# Patient Record
Sex: Female | Born: 1950 | Race: White | Hispanic: No | Marital: Married | Smoking: Never smoker
Health system: Southern US, Community
[De-identification: ages and names within clinical notes are randomized; demographics above are authoritative.]

## PROBLEM LIST (undated history)

## (undated) DIAGNOSIS — Z950 Presence of cardiac pacemaker: Secondary | ICD-10-CM

## (undated) DIAGNOSIS — R112 Nausea with vomiting, unspecified: Secondary | ICD-10-CM

## (undated) DIAGNOSIS — I959 Hypotension, unspecified: Secondary | ICD-10-CM

## (undated) DIAGNOSIS — E785 Hyperlipidemia, unspecified: Secondary | ICD-10-CM

## (undated) DIAGNOSIS — T8859XA Other complications of anesthesia, initial encounter: Secondary | ICD-10-CM

## (undated) DIAGNOSIS — I10 Essential (primary) hypertension: Secondary | ICD-10-CM

## (undated) DIAGNOSIS — R0789 Other chest pain: Secondary | ICD-10-CM

## (undated) DIAGNOSIS — I447 Left bundle-branch block, unspecified: Secondary | ICD-10-CM

## (undated) DIAGNOSIS — I2510 Atherosclerotic heart disease of native coronary artery without angina pectoris: Secondary | ICD-10-CM

## (undated) HISTORY — DX: Left bundle-branch block, unspecified: I44.7

## (undated) HISTORY — DX: Hypotension, unspecified: I95.9

## (undated) HISTORY — DX: Hyperlipidemia, unspecified: E78.5

## (undated) HISTORY — DX: Other chest pain: R07.89

## (undated) HISTORY — DX: Essential (primary) hypertension: I10

## (undated) HISTORY — DX: Atherosclerotic heart disease of native coronary artery without angina pectoris: I25.10

## (undated) HISTORY — PX: ROTATOR CUFF REPAIR: SHX139

## (undated) HISTORY — PX: ABDOMINAL HYSTERECTOMY: SUR658

---

## 1998-12-31 ENCOUNTER — Encounter: Payer: Self-pay | Admitting: Family Medicine

## 1998-12-31 ENCOUNTER — Encounter: Admission: RE | Admit: 1998-12-31 | Discharge: 1998-12-31 | Payer: Self-pay | Admitting: Family Medicine

## 1999-09-21 ENCOUNTER — Encounter: Admission: RE | Admit: 1999-09-21 | Discharge: 1999-09-21 | Payer: Self-pay | Admitting: Family Medicine

## 1999-09-21 ENCOUNTER — Encounter: Payer: Self-pay | Admitting: Family Medicine

## 2000-05-30 ENCOUNTER — Encounter: Payer: Self-pay | Admitting: Family Medicine

## 2000-05-30 ENCOUNTER — Encounter: Admission: RE | Admit: 2000-05-30 | Discharge: 2000-05-30 | Payer: Self-pay | Admitting: Family Medicine

## 2000-11-07 ENCOUNTER — Encounter: Admission: RE | Admit: 2000-11-07 | Discharge: 2000-11-07 | Payer: Self-pay | Admitting: Family Medicine

## 2000-11-07 ENCOUNTER — Encounter: Payer: Self-pay | Admitting: Family Medicine

## 2000-11-21 ENCOUNTER — Ambulatory Visit (HOSPITAL_COMMUNITY): Admission: AD | Admit: 2000-11-21 | Discharge: 2000-11-21 | Payer: Self-pay | Admitting: *Deleted

## 2000-11-21 HISTORY — PX: CARDIAC CATHETERIZATION: SHX172

## 2002-04-02 ENCOUNTER — Encounter: Admission: RE | Admit: 2002-04-02 | Discharge: 2002-04-02 | Payer: Self-pay | Admitting: Orthopedic Surgery

## 2002-04-02 ENCOUNTER — Encounter: Payer: Self-pay | Admitting: Orthopedic Surgery

## 2002-04-04 ENCOUNTER — Ambulatory Visit (HOSPITAL_BASED_OUTPATIENT_CLINIC_OR_DEPARTMENT_OTHER): Admission: RE | Admit: 2002-04-04 | Discharge: 2002-04-04 | Payer: Self-pay | Admitting: Orthopedic Surgery

## 2002-05-01 ENCOUNTER — Encounter: Admission: RE | Admit: 2002-05-01 | Discharge: 2002-07-30 | Payer: Self-pay | Admitting: Family Medicine

## 2004-01-29 ENCOUNTER — Encounter: Admission: RE | Admit: 2004-01-29 | Discharge: 2004-01-29 | Payer: Self-pay | Admitting: Family Medicine

## 2004-06-03 ENCOUNTER — Ambulatory Visit (HOSPITAL_BASED_OUTPATIENT_CLINIC_OR_DEPARTMENT_OTHER): Admission: RE | Admit: 2004-06-03 | Discharge: 2004-06-03 | Payer: Self-pay | Admitting: Orthopedic Surgery

## 2004-06-03 ENCOUNTER — Ambulatory Visit (HOSPITAL_COMMUNITY): Admission: RE | Admit: 2004-06-03 | Discharge: 2004-06-03 | Payer: Self-pay | Admitting: Orthopedic Surgery

## 2004-06-17 ENCOUNTER — Ambulatory Visit (HOSPITAL_COMMUNITY): Admission: RE | Admit: 2004-06-17 | Discharge: 2004-06-17 | Payer: Self-pay | Admitting: Orthopedic Surgery

## 2004-06-17 ENCOUNTER — Ambulatory Visit (HOSPITAL_BASED_OUTPATIENT_CLINIC_OR_DEPARTMENT_OTHER): Admission: RE | Admit: 2004-06-17 | Discharge: 2004-06-17 | Payer: Self-pay | Admitting: Orthopedic Surgery

## 2005-06-02 ENCOUNTER — Encounter: Admission: RE | Admit: 2005-06-02 | Discharge: 2005-06-02 | Payer: Self-pay | Admitting: Family Medicine

## 2006-03-30 HISTORY — PX: CARDIOVASCULAR STRESS TEST: SHX262

## 2006-07-21 ENCOUNTER — Encounter: Admission: RE | Admit: 2006-07-21 | Discharge: 2006-07-21 | Payer: Self-pay | Admitting: Family Medicine

## 2008-09-25 HISTORY — PX: US ECHOCARDIOGRAPHY: HXRAD669

## 2009-01-19 ENCOUNTER — Encounter: Admission: RE | Admit: 2009-01-19 | Discharge: 2009-01-19 | Payer: Self-pay | Admitting: Family Medicine

## 2010-07-16 NOTE — Cardiovascular Report (Signed)
[place]  Patient:    Molly Carpenter, Molly Carpenter Visit Number: [id number] MRN: [id number]          Service Type: CAT Location: [place] [facility] Attending Physician:  Rutherford Limerick Dictated by:   Fabio Asa, M.D. Proc. Date: 11/21/00 Admit Date:  11/21/2000   CC:         Lindwood Qua, M.D.   Cardiac Catheterization  INDICATIONS FOR PROCEDURE:  Ongoing dyspnea.  Stress Cardiolite revealing decreased uptake in the anteroapical wall, with minimal redistribution. Ejection fraction of 70%.  PROCEDURE:  After obtaining written, informed consent, the patient was brought to the cardiac catheterization lab in the postabsorptive state. Preobtundation was achieved using IV Versed.  The right femoral head was identified using radiographic technique.  Local anesthesia was achieved using 1% Xylocaine.  A 6-French hemostasis sheath was placed into the right femoral artery using the modified Seldinger technique.  Selective coronary angiography was performed using JL4 and JR4 Judkins catheters.  Multiple views were obtained.  All catheters were exchanged and made over a guide wire. Single-plane ventriculogram was performed in the RAO position, using a 6-French pigtail curved catheter.  The films were reviewed with Dr. Leonia Reeves. It was not felt that percutaneous intervention would offer benefit.  The patient was then transferred to the holding area.  She had been previously given 3000 units of intravenous heparin.  Her sheath will be pulled when her ACT is less than 150.  FINDINGS: 1. Aortic pressure:  144/77. 2. Left ventricular pressure:  144/15.  Single-plane ventriculogram revealed mild anteroapical hypokinesis.  Ejection fraction of 55%.  CORONARY ANGIOGRAPHY: 1. LEFT MAIN CORONARY ARTERY:  Bifurcated into the left anterior descending    and circumflex vessel. 2. LEFT ANTERIOR DESCENDING ARTERY:  Gave rise to a small D1, moderate D2,    two  septal perforators.  The descending went on to end at the apex.  There    was diffuse disease following the second septal perforator, up to 70-80%.    The left anterior descending was 1/3 the caliber of the size of the    circumflex vessel. 3. CIRCUMFLEX CORONARY ARTERY:  A moderate-sized vessel, giving rise to    a small OM1, small OM2 and a large bifurcating OM3.  It then went on to    end as an AV groove vessel.  There was no significant disease in the    circumflex vessel. 4. RIGHT CORONARY ARTERY:  A large, dominant artery; giving rise to three    small RV marginals, PDA and a PL branch.  There was no significant disease    in the right coronary artery or its branches.  IMPRESSION: 1. Mild anteroapical hypokinesis.  Ejection fraction of approximately 50%.    No mitral regurgitation. 2. Diffuse critical disease in the distal LAD, which is not amenable to    revascularization.  PLAN:  We will continue with medical management with aspirin, Imdur 60 mg daily, sublingual nitroglycerin p.r.n.   Her LDL goal is less than 100.  I will see her back in my office in two weeks for further evaluation.Dictated by: Fabio Asa, M.D. Attending Physician:  Fabio Asa A DD:  11/21/00 TD:  11/21/00 Job: [id number]

## 2010-07-16 NOTE — Op Note (Signed)
NAME:  Molly Carpenter, Molly Carpenter          ACCOUNT NO.:  [id number]   MEDICAL RECORD NO.:  [id number]          PATIENT TYPE:  AMB   LOCATION:  [place]                          FACILITY:  [place]   PHYSICIAN:  Ninetta Lights, M.D. 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   OPERATIVE PROCEDURES:  1.  Left shoulder examination and manipulation under anesthesia.  2.  Arthroscopy with debridement and lysis of intra-articular adhesions.  3.  Debridement of rotator cuff from above and below.  4.  Acromioplasty with coracoacromial ligament release.  5.  Excision, distal clavicle.   SURGEON:  Ninetta Lights, M.D.   ASSISTANT:  Alyson Locket. Velora Heckler.   ANESTHESIA:  General.   ESTIMATED BLOOD LOSS:  Minimal.   SPECIMENS:  None.   CULTURES:  None.   COMPLICATIONS:  None.   DATA REVIEWED:  Soft compressive with sling.   PROCEDURE:  Patient brought to the operating room and after adequate  anesthesia had been obtained, placed in the beach chair positioner on the  shoulder positioner and prepped and draped in the usual sterile fashion.  The shoulder was examined.  A moderate adhesive capsulitis going to about  140 degrees of forward flexion and abduction, abruptly stopping from  adhesions.  Manipulated, breaking up all adhesions, bringing the arm  completely overhead.  Internal and external rotation about 60% of normal,  limited by adhesions.  The arm was manipulated there as well, achieving full  motion including full rotation, breaking up all adhesions.  Maintained good  stability and alignment.  Proceeded with arthroscopy.  Anterior, posterior  and lateral portals.   Shoulder entered with a blunt obturator, distended and  inspected.  Evidence of tearing of the inferior capsule from manipulation  evident.  Intra-articular adhesions debrided.  Reactive synovitis debrided.  A little roughing, undersurface rotator cuff anteriorly, debrided.  Biceps  tendon and biceps anchor intact.  Labrum intact.  Remainder of articular  cartilage excellent.  After debridement of the joint, cannula redirected  subacromially.  Type 2-3 acromion, marked reactive bursitis and impingement.  Roughening of the top of the cuff.  Bursa resected, cuff debrided.  Acromioplasty to a type 1 acromion with shaver and high-speed bur, releasing  the CA ligament.  Grade 3 changes to the [facility] joint with impingement from  there as well.  The lateral centimeter resected.  At completion, adequacy of  decompression confirmed throughout viewing from all portals.  Cuff  thoroughly inspected and although abraded, it was still intact.  Instruments  and fluid removed.  Portals, shoulder and bursa injected with Marcaine.  Portals closed with 4-0 nylon.  A sterile compressive dressing applied.  Anesthesia reversed.  Brought to the recovery room.  Tolerated the surgery  well with no complications.      DFM/MEDQ  D:  06/17/2004  T:  06/17/2004  Job:  [id number]

## 2010-07-16 NOTE — Op Note (Signed)
NAME:  Molly Carpenter, Molly Carpenter                    ACCOUNT NO.:  [id number]   MEDICAL RECORD NO.:  [id number]                   PATIENT TYPE:  AMB   LOCATION:  [place]                                  FACILITY:  [place]   PHYSICIAN:  Ninetta Lights, M.D.              DATE OF BIRTH:  September 15, 1950   DATE OF PROCEDURE:  04/04/2002  DATE OF DISCHARGE:                                 OPERATIVE REPORT   PREOPERATIVE DIAGNOSIS:  Radial tunnel syndrome, right arm.   POSTOPERATIVE DIAGNOSIS:  Radial tunnel syndrome, right arm.   OPERATIVE PROCEDURE:  Decompression of motor branch, radial nerve, in radial  tunnel right forearm.   SURGEON:  Ninetta Lights, M.D.   ASSISTANT:  Aaron Edelman D. Petrarca, P.A.-C.   ANESTHESIA:  General.   ESTIMATED BLOOD LOSS:  Minimal.   TOURNIQUET TIME:  45 minutes.   SPECIMENS:  None.   CULTURES:  None.   COMPLICATIONS:  None.   DRESSING:  Soft compressive.   PROCEDURE:  The patient was brought to the operating room and placed on the  operative table in the supine position.  After adequate anesthesia had been  obtained the tourniquet was applied on the upper aspect of the right arm.  The arm was prepped and draped in the usual sterile fashion.  Exsanguinated  with elevation of the Esmarch and the tourniquet was inflated to 250 mmHg.  A longitudinal incision over the volar margin of the extensor musculature  from just distal to the elbow crease extending distally.  The skin and  subcutaneous tissue were divided.  Blunt dissection was used to expose the  margin of the extensors which were elevated.  The superficial branch of the  radial nerve identified and followed back with careful dissection to the  bifurcation from the common radial nerve.  The motor branch was then  followed deep along its normal course.  There was marked constriction at the  margin of the supinator muscle arcade of Frohse.  Complete decompression of  the motor branch of the radial nerve  from that level distally with splitting  of the supinator to allow complete decompression of the nerve.  The area of  marked constriction was right at the margin of the supinator and the  dissection was carried distally until the nerve had a normal appearance  again.  The entirety of the motor and sensory branches protected and  completely decompressed throughout.  No other abnormalities were seen.  The  arm was taken from full pronation to supination after decompression to be  sure all compression was removed from the radial nerve.  The wound was  irrigated.  The extensors were allowed to close to their normal position.  The subcutaneous and subcuticular closure of the wound  was performed with Vicryl and Steri-Strips and the margins injected with  Marcaine. A sterile compressive dressing was applied.  The tourniquet was  deflated and anesthesia was reversed.  She  was brought to the recovery room.  She tolerated the surgery well with no complications.                                                Ninetta Lights, M.D.    DFM/MEDQ  D:  04/04/2002  T:  04/04/2002  Job:  [id number]

## 2010-09-22 ENCOUNTER — Encounter: Payer: Self-pay | Admitting: Cardiology

## 2010-09-24 ENCOUNTER — Encounter: Payer: Self-pay | Admitting: Cardiology

## 2010-09-24 ENCOUNTER — Ambulatory Visit (INDEPENDENT_AMBULATORY_CARE_PROVIDER_SITE_OTHER): Payer: 59 | Admitting: Cardiology

## 2010-09-24 VITALS — BP 104/62 | HR 99 | Ht 64.5 in | Wt 161.2 lb

## 2010-09-24 DIAGNOSIS — E785 Hyperlipidemia, unspecified: Secondary | ICD-10-CM

## 2010-09-24 DIAGNOSIS — E119 Type 2 diabetes mellitus without complications: Secondary | ICD-10-CM

## 2010-09-24 DIAGNOSIS — I10 Essential (primary) hypertension: Secondary | ICD-10-CM

## 2010-09-24 DIAGNOSIS — I447 Left bundle-branch block, unspecified: Secondary | ICD-10-CM | POA: Insufficient documentation

## 2010-09-24 DIAGNOSIS — I2510 Atherosclerotic heart disease of native coronary artery without angina pectoris: Secondary | ICD-10-CM

## 2010-09-24 NOTE — Patient Instructions (Signed)
We will schedule you for a nuclear stress test.  If your stress test is normal I would recommend stopping the isosorbide.  Continue your other medications and try to get regular aerobic exercise.

## 2010-09-24 NOTE — Progress Notes (Signed)
[place] Date of Birth: 24-Nov-1950   History of Present Illness: Molly Carpenter is seen today for followup. She has done very well without any recurrent symptoms of chest pain or shortness of breath. She admits that she hasn't been exercising much during the summer. She occasionally will get some shortness of breath with exertion. She recently has had trouble with numbness in her right face and is being treated for potential sinus infection.  Current Outpatient Prescriptions on File Prior to Visit  Medication Sig Dispense Refill  . aspirin 81 MG tablet Take 81 mg by mouth daily.        . cholecalciferol (VITAMIN D) 1000 UNITS tablet Take 2,000 Units by mouth daily.        . colesevelam (WELCHOL) 625 MG tablet Take 1,875 mg by mouth 2 (two) times daily with a meal.        . ezetimibe-simvastatin (VYTORIN) 10-40 MG per tablet Take 1 tablet by mouth at bedtime.        Marland Kitchen glimepiride (AMARYL) 4 MG tablet Take 4 mg by mouth daily before breakfast.        . isosorbide mononitrate (IMDUR) 30 MG 24 hr tablet Take 30 mg by mouth daily.        . sitaGLIPtan-metformin (JANUMET) 50-500 MG per tablet Take 1 tablet by mouth 2 (two) times daily with a meal.        . sitaGLIPtin (JANUVIA) 100 MG tablet Take 100 mg by mouth daily.        Marland Kitchen telmisartan (MICARDIS) 40 MG tablet Take 40 mg by mouth daily.          No Known Allergies  Past Medical History  Diagnosis Date  . Coronary artery disease   . LBBB (left bundle branch block)   . Diabetes mellitus   . Hyperlipidemia   . Hypertension   . Chest tightness     Past Surgical History  Procedure Date  . Cardiac catheterization 11/21/2000    EF 55%. sHOWED A 70-80% STENOSIS IN THE SECOND SEPTAL PERFORATOR BRANCH.  Marland Kitchen Cesarean section   . Rotator cuff repair   . US echocardiography 09/25/2008    EF 50%  . Cardiovascular stress test 03/30/2006    EF 50%  . Abdominal hysterectomy     History  Smoking status  . Never Smoker   Smokeless  tobacco  . Not on file    History  Alcohol Use No    Family History  Problem Relation Age of Onset  . Heart attack Mother   . Stroke Father   . Diabetes Sister   . Hypertension Sister   . Diabetes Brother   . Heart attack Brother   . Diabetes Brother     Review of Systems: As noted in history of present illness. All other systems were reviewed and are negative.  Physical Exam: BP 104/62  Pulse 99  Ht 5' 4.5" (1.638 m)  Wt 161 lb 3.2 oz (73.12 kg)  BMI 27.24 kg/m2 She is a pleasant, overweight white female in no acute distress. She is normocephalic, atraumatic. Pupils are equal round and reactive to light and accommodation. Extraocular movements are full. Oropharynx is clear. Neck is supple no JVD, adenopathy, thyromegaly, or bruits. Lungs are clear. Cardiac exam reveals a regular rate and rhythm without gallop, murmur, or click. Abdomen is nontender. She has no edema. Pedal pulses are good. Skin is warm and dry. Neurologic exam is nonfocal. LABORATORY DATA: ECG demonstrates normal sinus rhythm  with occasional PVCs. Shows a left bundle branch block. Assessment / Plan:

## 2010-09-24 NOTE — Assessment & Plan Note (Signed)
She was asymptomatic. I wonder whether she really needs to continue taking isosorbide. We will schedule her for a followup nuclear stress test and if this is normal I would recommend stopping her isosorbide.

## 2010-09-24 NOTE — Assessment & Plan Note (Signed)
Blood pressure is adequately controlled on current medications.

## 2010-09-28 ENCOUNTER — Encounter: Payer: Self-pay | Admitting: Cardiology

## 2010-09-29 ENCOUNTER — Encounter: Payer: Self-pay | Admitting: *Deleted

## 2010-10-04 ENCOUNTER — Ambulatory Visit (HOSPITAL_COMMUNITY): Payer: 59 | Attending: Cardiology | Admitting: Radiology

## 2010-10-04 VITALS — Ht 64.0 in | Wt 157.0 lb

## 2010-10-04 DIAGNOSIS — I447 Left bundle-branch block, unspecified: Secondary | ICD-10-CM

## 2010-10-04 DIAGNOSIS — I2510 Atherosclerotic heart disease of native coronary artery without angina pectoris: Secondary | ICD-10-CM

## 2010-10-04 DIAGNOSIS — R0600 Dyspnea, unspecified: Secondary | ICD-10-CM

## 2010-10-04 MED ORDER — ADENOSINE (DIAGNOSTIC) 3 MG/ML IV SOLN
0.8400 mg/kg | Freq: Once | INTRAVENOUS | Status: AC
  Administered 2010-10-04: 59.7 mg via INTRAVENOUS

## 2010-10-04 MED ORDER — TECHNETIUM TC 99M TETROFOSMIN IV KIT
11.0000 | PACK | Freq: Once | INTRAVENOUS | Status: AC | PRN
  Administered 2010-10-04: 11 via INTRAVENOUS

## 2010-10-04 MED ORDER — TECHNETIUM TC 99M TETROFOSMIN IV KIT
33.0000 | PACK | Freq: Once | INTRAVENOUS | Status: AC | PRN
  Administered 2010-10-04: 33 via INTRAVENOUS

## 2010-10-04 MED ORDER — REGADENOSON 0.4 MG/5ML IV SOLN
0.4000 mg | Freq: Once | INTRAVENOUS | Status: AC
  Administered 2010-10-04: 0.4 mg via INTRAVENOUS

## 2010-10-04 NOTE — Progress Notes (Signed)
[place] [address] [phone]  Cardiology Nuclear Med Study  Molly Carpenter is a 60 y.o. female [id number] [birth year]   Nuclear Med Background Indication for Stress Test:  Evaluation for Ischemia History:  '02 Cath: 70-80% LAD , EF=55% Cardiac Risk Factors: Family History - CAD, Hypertension, LBBB, Lipids and NIDDM  Symptoms:  DOE and Near Syncope related to low blood sugar   Nuclear Pre-Procedure Caffeine/Decaff Intake:  None NPO After: 8:00pm   Lungs:  Clear IV 0.9% NS with Angio Cath:  20g  IV Site: R Forearm  IV Started by:  Eliezer Lofts, EMT-P  Chest Size (in):  36 Cup Size: DD  Height: 5\' 4"  (1.626 m)  Weight:  157 lb (71.215 kg)  BMI:  Body mass index is 26.95 kg/(m^2). Tech Comments:  CBG=105 @ 7:45, per patient.    Nuclear Med Study 1 or 2 day study: 1 day  Stress Test Type:  Adenosine  Reading MD: Kirk Ruths, MD  Order Authorizing Provider:  Peter Martinique, MD  Resting Radionuclide: Technetium 43m Tetrofosmin  Resting Radionuclide Dose: 11.0 mCi   Stress Radionuclide:  Technetium 77m Tetrofosmin  Stress Radionuclide Dose: 33.0 mCi           Stress Protocol Rest HR: 85 Stress HR: 116  Rest BP: 98/64 Stress BP: 120/60  Exercise Time (min): n/a METS: n/a   Predicted Max HR: 161 bpm % Max HR: 72.05 bpm Rate Pressure Product: 13920   Dose of Adenosine (mg):  40.0 Dose of Lexiscan: n/a mg  Dose of Atropine (mg): n/a Dose of Dobutamine: n/a mcg/kg/min (at max HR)  Stress Test Technologist: Irven Baltimore, RN  Nuclear Technologist:  Annye Rusk, CNMT     Rest Procedure:  Myocardial perfusion imaging was performed at rest 45 minutes following the intravenous administration of Technetium 66m Tetrofosmin. Rest ECG: NSR with LBBB  Stress Procedure:  The patient received IV adenosine at 140 mcg/kg/min for 4 minutes.  The EKG was non-diagnostic due to baseline LBBB.   Technetium 90m  Tetrofosmin was injected at the 2-minute mark and quantitative spect images were obtained. Stress ECG: Uninteretable due to baseline LBBB  QPS Raw Data Images:  Acquisition technically good; normal left ventricular size. Stress Images:  There is decreased uptake in the apex, distal anterior and septal wall. Rest Images:  There is decreased uptake in the apex, distal anterior and septal wall. Subtraction (SDS):  No evidence of ischemia. Transient Ischemic Dilatation (Normal <1.22):  1.16 Lung/Heart Ratio (Normal <0.45):  0.30  Quantitative Gated Spect Images QGS EDV:  77 ml QGS ESV:  36 ml QGS cine images:  NL LV Function; NL Wall Motion QGS EF: 53%  Impression Exercise Capacity:  Adenosine study with no exercise. BP Response:  Normal blood pressure response. Clinical Symptoms:  No chest pain. ECG Impression:  Baseline:  LBBB.  EKG uninterpretable due to LBBB at rest and stress. Comparison with Prior Nuclear Study: No images to compare  Overall Impression:  Low risk stress nuclear study with fixed distal anterior, apical and septal defect most likely related to LBBB; no ischemia.  Kirk Ruths

## 2010-10-05 NOTE — Progress Notes (Signed)
nuc med report routed to Dr. Martinique 10/05/10 Charlton Amor

## 2010-10-06 NOTE — Progress Notes (Signed)
Notified of nuclear stress test. May stop Isosorbide. Will send copy to Dr.Kimberly Brigitte Pulse

## 2010-10-06 NOTE — Progress Notes (Signed)
Nuclear study looks good. No ischemia. Normal LV function. Ok to stop isosorbide.  Collier Salina Martinique

## 2010-10-07 ENCOUNTER — Other Ambulatory Visit: Payer: Self-pay | Admitting: Family Medicine

## 2010-10-07 DIAGNOSIS — R200 Anesthesia of skin: Secondary | ICD-10-CM

## 2010-10-08 ENCOUNTER — Ambulatory Visit
Admission: RE | Admit: 2010-10-08 | Discharge: 2010-10-08 | Disposition: A | Discharge status: Home / Self Care | Payer: 59 | Source: Ambulatory Visit | Attending: Family Medicine | Admitting: Family Medicine

## 2010-10-08 DIAGNOSIS — R200 Anesthesia of skin: Secondary | ICD-10-CM

## 2010-11-22 ENCOUNTER — Telehealth: Payer: Self-pay | Admitting: Cardiology

## 2010-11-22 NOTE — Telephone Encounter (Signed)
Called stating Dr. Martinique advised her to stop Imdur in August since stress test was nml. Has been having chest pressure over the last week off and on. No SOB. Spoke w/Lori and suggested she restart Imdur. Will let us know if chest pressure not resolved.

## 2010-11-22 NOTE — Telephone Encounter (Signed)
Pt is off heart med now she is feeling pressure in her chest please call no pain

## 2011-08-02 ENCOUNTER — Other Ambulatory Visit: Payer: Self-pay | Admitting: Family Medicine

## 2011-08-02 ENCOUNTER — Ambulatory Visit
Admission: RE | Admit: 2011-08-02 | Discharge: 2011-08-02 | Disposition: A | Discharge status: Home / Self Care | Payer: 59 | Source: Ambulatory Visit | Attending: Family Medicine | Admitting: Family Medicine

## 2011-08-02 DIAGNOSIS — I959 Hypotension, unspecified: Secondary | ICD-10-CM

## 2011-08-02 DIAGNOSIS — R05 Cough: Secondary | ICD-10-CM

## 2011-08-05 ENCOUNTER — Ambulatory Visit (INDEPENDENT_AMBULATORY_CARE_PROVIDER_SITE_OTHER): Payer: 59 | Admitting: Physician Assistant

## 2011-08-05 ENCOUNTER — Encounter: Payer: Self-pay | Admitting: Physician Assistant

## 2011-08-05 VITALS — BP 118/60 | HR 84 | Ht 64.0 in | Wt 159.0 lb

## 2011-08-05 DIAGNOSIS — I959 Hypotension, unspecified: Secondary | ICD-10-CM

## 2011-08-05 DIAGNOSIS — E119 Type 2 diabetes mellitus without complications: Secondary | ICD-10-CM

## 2011-08-05 DIAGNOSIS — I2510 Atherosclerotic heart disease of native coronary artery without angina pectoris: Secondary | ICD-10-CM

## 2011-08-05 DIAGNOSIS — I10 Essential (primary) hypertension: Secondary | ICD-10-CM

## 2011-08-05 NOTE — Patient Instructions (Signed)
If you have recurrent chest pain like you have had in the past, you may restart your Imdur at 1/2 tablet daily. If your chest pain is different or worse, call us for a sooner appointment. If your BPs rise above 135/85, restart your Micardis and call your PCP for lab work one week later. Keep follow up with Dr. Peter Martinique in July.

## 2011-08-05 NOTE — Progress Notes (Signed)
[place], [place]  [address] Phone: [phone] Fax:  [phone]  Date:  08/05/2011   Name:  Molly Carpenter   DOB:  October 07, 1950   MRN:  [id number]  PCP:  Mayra Neer, MD, MD  Primary Cardiologist:  Dr. Peter Martinique  Primary Electrophysiologist:  None    History of Present Illness: Molly Carpenter is a 61 y.o. female who presents for evaluation of low BP.  She is seen for Dr. Cleatis Polka (DOD).  She has a history of CAD, LBBB, DM2, HTN, HL.  LHC 10/2000: dLAD 70-80% after a second septal perforator, EF 50%.  She was treated medically.  Last echo 08/2008: EF 50%, septal dyskinesis consistent with left bundle branch block, diastolic dysfunction, mild aortic sclerosis, no aortic stenosis, trivial MR and TR.  Last Nuclear study 09/2010: EF 53%, fixed distal anterior, apical and septal defect most likely related to LBBB, no ischemia.  She retired in January.  She typically drank 8-10 soft drinks per day.  Since that time, she has not had as much fluid to drink.  She has been doing well without chest pain, shortness of breath, syncope, orthopnea, PND or edema.  She had stopped her isosorbide last year.  She did note chest pressure with exertion some time after this and was placed back on isosorbide.  She was stable on this.  She went to her doctor earlier this week.  Apparently her blood pressure was quite low (70s to 80s).  She saw her PCP.  Her isosorbide and Micardis were both discontinued.  She apparently had lab evidence of dehydration.  She was seen again in follow up 08/04/11.  BUN was 16 and creatinine 1.06.  Her blood pressures were improved.  She states she feels much better and feels back to normal.  Wt Readings from Last 3 Encounters:  08/05/11 159 lb (72.122 kg)  10/04/10 157 lb (71.215 kg)  09/24/10 161 lb 3.2 oz (73.12 kg)     Past Medical History  Diagnosis Date  . Coronary artery disease     LHC 10/2000: dLAD 70-80% after a second  septal perforator, EF 50%.  She was treated medically; Nuclear study 09/2010: EF 53%, fixed distal anterior, apical and septal defect most likely related to LBBB, no ischemia.   Marland Kitchen LBBB (left bundle branch block)   . Diabetes mellitus   . Hyperlipidemia   . Hypertension   . Chest tightness     Current Outpatient Prescriptions  Medication Sig Dispense Refill  . aspirin 81 MG tablet Take 81 mg by mouth daily.        . cholecalciferol (VITAMIN D) 1000 UNITS tablet Take 2,000 Units by mouth daily.        . colesevelam (WELCHOL) 625 MG tablet Take 1,875 mg by mouth 2 (two) times daily with a meal.        . ezetimibe-simvastatin (VYTORIN) 10-40 MG per tablet Take 1 tablet by mouth at bedtime.        Marland Kitchen glimepiride (AMARYL) 4 MG tablet Take 4 mg by mouth daily before breakfast.        . sitaGLIPtan-metformin (JANUMET) 50-500 MG per tablet Take 1 tablet by mouth 2 (two) times daily with a meal.        . sitaGLIPtin (JANUVIA) 100 MG tablet Take 100 mg by mouth daily.        . isosorbide mononitrate (IMDUR) 30 MG 24 hr tablet Take 30 mg by mouth daily. (  ON HOLD )      . telmisartan (MICARDIS) 40 MG tablet Take 40 mg by mouth daily. ( ON HOLD )        Allergies: No Known Allergies  History  Substance Use Topics  . Smoking status: Never Smoker   . Smokeless tobacco: Never Used  . Alcohol Use: No     ROS:  Please see the history of present illness.  She denies fevers, chills, significant cough, vomiting, diarrhea, orthopnea, PND, edema, orthostatic intolerance.  She denies melena or hematochezia.  She has felt tired.   All other systems reviewed and negative.   PHYSICAL EXAM: VS:  BP 118/60  Pulse 84  Ht 5\' 4"  (1.626 m)  Wt 159 lb (72.122 kg)  BMI 27.29 kg/m2  Orthostatic vital signs: Lying116/69, sitting 113/71, standing 122/74  Well nourished, well developed, in no acute distress HEENT: normal Endocrine: No thyromegaly Neck: no JVD Vascular: No carotid bruits Cardiac:  normal S1,  S2; RRR; no murmur Lungs:  clear to auscultation bilaterally, no wheezing, rhonchi or rales Abd: soft, nontender, no hepatomegaly Ext: no edema Skin: warm and dry Neuro:  CNs 2-12 intact, no focal abnormalities noted  EKG:  Sinus rhythm, heart rate 84, left bundle branch block   ASSESSMENT AND PLAN:  1.  Hypotension Resolved. I suspect she changed her fluid intake and became dehydrated.  She also lost weight recently which helped contribute to lower blood pressures. No further cardiovascular workup at this time.  2.  Coronary Artery Disease Stable.  No angina. Continue aspirin and WelChol If she develops recurrent chest discomfort consistent with her prior angina, she can restart Isosorbide at 15 mg a day. Followup with Dr. Martinique as scheduled next month.  3.  Diabetes Mellitus She may remain off of Micardis for now. She would benefit from being on this for renal protection. If her BP increases, she can restart. Otherwise, she can d/w her PCP at next visit.    Signed, Richardson Dopp, PA-C  11:36 AM 08/05/2011

## 2011-09-27 ENCOUNTER — Ambulatory Visit (INDEPENDENT_AMBULATORY_CARE_PROVIDER_SITE_OTHER): Payer: 59 | Admitting: Cardiology

## 2011-09-27 ENCOUNTER — Encounter: Payer: Self-pay | Admitting: Cardiology

## 2011-09-27 VITALS — BP 120/62 | HR 81 | Ht 64.0 in | Wt 160.8 lb

## 2011-09-27 DIAGNOSIS — I959 Hypotension, unspecified: Secondary | ICD-10-CM | POA: Insufficient documentation

## 2011-09-27 DIAGNOSIS — I2510 Atherosclerotic heart disease of native coronary artery without angina pectoris: Secondary | ICD-10-CM

## 2011-09-27 DIAGNOSIS — E119 Type 2 diabetes mellitus without complications: Secondary | ICD-10-CM

## 2011-09-27 DIAGNOSIS — I10 Essential (primary) hypertension: Secondary | ICD-10-CM

## 2011-09-27 NOTE — Patient Instructions (Signed)
Continue your current therapy.  Call if you have any more low blood pressure readings  I will see you again in one year

## 2011-09-27 NOTE — Assessment & Plan Note (Signed)
Her hypotension has resolved. She is now back on her micardis. I would leave her off of isosorbide at this time since she is having no anginal symptoms. If she has further hypotension we may need to consider reducing her micardis dose or stopping it.

## 2011-09-27 NOTE — Progress Notes (Signed)
[place] Date of Birth: 08-Jul-1950   History of Present Illness: Molly Carpenter is seen today for followup. She has done very well without any recurrent symptoms of chest pain or shortness of breath. She retired in January of this year and has been quite active. In June she was noted to have significant hypotension during an eye exam. Her Micardis and isosorbide were discontinued. 2 weeks ago she was started back on her Micardis and has been monitoring her blood pressure without significant hypotension. She feels well. She reports her diabetes and hyperlipidemia are under control. She denies any chest pain. She did have a nuclear stress test in August of 2012 which showed a fixed anterior apical and septal defect most likely related to left bundle branch block. There was no ischemia. Ejection fraction was 53%.  Current Outpatient Prescriptions on File Prior to Visit  Medication Sig Dispense Refill  . aspirin 81 MG tablet Take 81 mg by mouth daily.        . cholecalciferol (VITAMIN D) 1000 UNITS tablet Take 2,000 Units by mouth daily.        . colesevelam (WELCHOL) 625 MG tablet Take 1,875 mg by mouth 2 (two) times daily with a meal.        . ezetimibe-simvastatin (VYTORIN) 10-40 MG per tablet Take 1 tablet by mouth at bedtime.        Marland Kitchen glimepiride (AMARYL) 4 MG tablet Take 4 mg by mouth daily before breakfast.        . sitaGLIPtan-metformin (JANUMET) 50-500 MG per tablet Take 1 tablet by mouth 2 (two) times daily with a meal. 50/1000 MG      . telmisartan (MICARDIS) 40 MG tablet Take 40 mg by mouth daily. ( ON HOLD )        No Known Allergies  Past Medical History  Diagnosis Date  . Coronary artery disease     LHC 10/2000: dLAD 70-80% after a second septal perforator, EF 50%.  She was treated medically; Nuclear study 09/2010: EF 53%, fixed distal anterior, apical and septal defect most likely related to LBBB, no ischemia.   Marland Kitchen LBBB (left bundle branch block)   . Diabetes mellitus   .  Hyperlipidemia   . Hypertension   . Chest tightness   . Hypotension     Past Surgical History  Procedure Date  . Cardiac catheterization 11/21/2000    EF 55%. sHOWED A 70-80% STENOSIS IN THE SECOND SEPTAL PERFORATOR BRANCH.  Marland Kitchen Cesarean section   . Rotator cuff repair   . US echocardiography 09/25/2008    EF 50%  . Cardiovascular stress test 03/30/2006    EF 50%  . Abdominal hysterectomy     History  Smoking status  . Never Smoker   Smokeless tobacco  . Never Used    History  Alcohol Use No    Family History  Problem Relation Age of Onset  . Heart attack Mother   . Stroke Father   . Diabetes Sister   . Hypertension Sister   . Diabetes Brother   . Heart attack Brother   . Diabetes Brother     Review of Systems: As noted in history of present illness. All other systems were reviewed and are negative.  Physical Exam: BP 120/62  Pulse 81  Ht 5\' 4"  (1.626 m)  Wt 160 lb 12.8 oz (72.938 kg)  BMI 27.60 kg/m2  SpO2 99% She is a pleasant, overweight white female in no acute distress. She is normocephalic,  atraumatic. Pupils are equal round and reactive to light and accommodation. Extraocular movements are full. Oropharynx is clear. Neck is supple no JVD, adenopathy, thyromegaly, or bruits. Lungs are clear. Cardiac exam reveals a regular rate and rhythm without gallop, murmur, or click. Abdomen is nontender. She has no edema. Pedal pulses are good. Skin is warm and dry. Neurologic exam is nonfocal. LABORATORY DATA:  Assessment / Plan:

## 2011-09-27 NOTE — Assessment & Plan Note (Signed)
She has no significant anginal symptoms. Her last Myoview study showed no ischemia. We will continue with her risk factor modification. Continue with aspirin and statin therapy.

## 2011-12-15 ENCOUNTER — Other Ambulatory Visit: Payer: Self-pay | Admitting: Family Medicine

## 2011-12-15 DIAGNOSIS — Z1231 Encounter for screening mammogram for malignant neoplasm of breast: Secondary | ICD-10-CM

## 2012-01-09 ENCOUNTER — Ambulatory Visit: Payer: 59

## 2012-01-12 ENCOUNTER — Ambulatory Visit
Admission: RE | Admit: 2012-01-12 | Discharge: 2012-01-12 | Disposition: A | Discharge status: Home / Self Care | Payer: 59 | Source: Ambulatory Visit | Attending: Family Medicine | Admitting: Family Medicine

## 2012-01-12 DIAGNOSIS — Z1231 Encounter for screening mammogram for malignant neoplasm of breast: Secondary | ICD-10-CM

## 2012-12-19 ENCOUNTER — Other Ambulatory Visit: Payer: Self-pay

## 2012-12-19 DIAGNOSIS — Z1231 Encounter for screening mammogram for malignant neoplasm of breast: Secondary | ICD-10-CM

## 2012-12-20 ENCOUNTER — Ambulatory Visit: Payer: 59 | Admitting: Cardiology

## 2013-01-17 ENCOUNTER — Ambulatory Visit
Admission: RE | Admit: 2013-01-17 | Discharge: 2013-01-17 | Disposition: A | Discharge status: Home / Self Care | Payer: 59 | Source: Ambulatory Visit

## 2013-01-17 DIAGNOSIS — Z1231 Encounter for screening mammogram for malignant neoplasm of breast: Secondary | ICD-10-CM

## 2013-01-30 ENCOUNTER — Encounter (INDEPENDENT_AMBULATORY_CARE_PROVIDER_SITE_OTHER): Payer: Self-pay

## 2013-01-30 ENCOUNTER — Encounter: Payer: Self-pay | Admitting: Cardiology

## 2013-01-30 ENCOUNTER — Ambulatory Visit (INDEPENDENT_AMBULATORY_CARE_PROVIDER_SITE_OTHER): Payer: 59 | Admitting: Cardiology

## 2013-01-30 VITALS — BP 134/70 | HR 87 | Ht 64.0 in | Wt 157.0 lb

## 2013-01-30 DIAGNOSIS — I2510 Atherosclerotic heart disease of native coronary artery without angina pectoris: Secondary | ICD-10-CM

## 2013-01-30 DIAGNOSIS — I10 Essential (primary) hypertension: Secondary | ICD-10-CM

## 2013-01-30 DIAGNOSIS — I447 Left bundle-branch block, unspecified: Secondary | ICD-10-CM

## 2013-01-30 DIAGNOSIS — E785 Hyperlipidemia, unspecified: Secondary | ICD-10-CM

## 2013-01-30 NOTE — Progress Notes (Signed)
[place] Date of Birth: May 28, 1950   History of Present Illness: Molly Carpenter is seen today for yearly followup. She has a history of coronary disease with cardiac catheterization in 2002 showing moderate stenosis in a septal perforator branch.. She did have a nuclear stress test in August of 2012 which showed a fixed anterior apical and septal defect most likely related to left bundle branch block. There was no ischemia. Ejection fraction was 53%. She has a chronic left bundle branch block. She also has a history of hypertension, diabetes, and dyslipidemia. On followup today she reports she is doing very well. She has no significant chest pain or dyspnea. She reports her blood sugars and blood pressure have been under good control.  Current Outpatient Prescriptions on File Prior to Visit  Medication Sig Dispense Refill  . aspirin 81 MG tablet Take 81 mg by mouth daily.        . cholecalciferol (VITAMIN D) 1000 UNITS tablet Take 2,000 Units by mouth daily.        . colesevelam (WELCHOL) 625 MG tablet Take 1,875 mg by mouth 2 (two) times daily with a meal.        . ezetimibe-simvastatin (VYTORIN) 10-40 MG per tablet Take 1 tablet by mouth at bedtime.        Marland Kitchen glimepiride (AMARYL) 4 MG tablet Take 4 mg by mouth daily before breakfast.        . sitaGLIPtan-metformin (JANUMET) 50-500 MG per tablet Take 1 tablet by mouth 2 (two) times daily with a meal. 50/1000 MG      . telmisartan (MICARDIS) 40 MG tablet Take 40 mg by mouth daily. ( ON HOLD )       No current facility-administered medications on file prior to visit.    No Known Allergies  Past Medical History  Diagnosis Date  . Coronary artery disease     LHC 10/2000: dLAD 70-80% after a second septal perforator, EF 50%.  She was treated medically; Nuclear study 09/2010: EF 53%, fixed distal anterior, apical and septal defect most likely related to LBBB, no ischemia.   Marland Kitchen LBBB (left bundle branch block)   . Diabetes mellitus   .  Hyperlipidemia   . Hypertension   . Chest tightness   . Hypotension     Past Surgical History  Procedure Laterality Date  . Cardiac catheterization  11/21/2000    EF 55%. sHOWED A 70-80% STENOSIS IN THE SECOND SEPTAL PERFORATOR BRANCH.  Marland Kitchen Cesarean section    . Rotator cuff repair    . US echocardiography  09/25/2008    EF 50%  . Cardiovascular stress test  03/30/2006    EF 50%  . Abdominal hysterectomy      History  Smoking status  . Never Smoker   Smokeless tobacco  . Never Used    History  Alcohol Use No    Family History  Problem Relation Age of Onset  . Heart attack Mother   . Stroke Father   . Diabetes Sister   . Hypertension Sister   . Diabetes Brother   . Heart attack Brother   . Diabetes Brother     Review of Systems: As noted in history of present illness. All other systems were reviewed and are negative.  Physical Exam: BP 134/70  Pulse 87  Ht 5\' 4"  (1.626 m)  Wt 157 lb (71.215 kg)  BMI 26.94 kg/m2 She is a pleasant,  white female in no acute distress. HEENT exam is normal.  Neck is supple no JVD, adenopathy, thyromegaly, or bruits. Lungs are clear. Cardiac exam reveals a regular rate and rhythm without gallop, murmur, or click. Abdomen is nontender. She has no edema. Pedal pulses are good. Skin is warm and dry. Neurologic exam is nonfocal.  LABORATORY DATA: ECG demonstrates normal sinus rhythm with left axis deviation and a left bundle branch block. This is unchanged.  Assessment / Plan: 1. History of mild coronary disease. Patient is asymptomatic. Continue risk factor modification.  2. Hypertension-controlled.  3. Diabetes mellitus type 2.  Continue current therapy and I'll followup again in one year.

## 2013-01-30 NOTE — Patient Instructions (Signed)
Continue your current therapy  I will see you again in 1 year.

## 2013-11-12 ENCOUNTER — Encounter: Payer: Self-pay | Admitting: *Deleted

## 2014-02-04 ENCOUNTER — Ambulatory Visit (INDEPENDENT_AMBULATORY_CARE_PROVIDER_SITE_OTHER): Payer: 59 | Admitting: Cardiology

## 2014-02-04 ENCOUNTER — Encounter: Payer: Self-pay | Admitting: Cardiology

## 2014-02-04 VITALS — BP 122/78 | HR 70 | Ht 64.0 in | Wt 152.9 lb

## 2014-02-04 DIAGNOSIS — I447 Left bundle-branch block, unspecified: Secondary | ICD-10-CM

## 2014-02-04 DIAGNOSIS — E785 Hyperlipidemia, unspecified: Secondary | ICD-10-CM

## 2014-02-04 DIAGNOSIS — I2510 Atherosclerotic heart disease of native coronary artery without angina pectoris: Secondary | ICD-10-CM

## 2014-02-04 DIAGNOSIS — I10 Essential (primary) hypertension: Secondary | ICD-10-CM

## 2014-02-04 NOTE — Progress Notes (Signed)
[place] Date of Birth: 1951-01-20   History of Present Illness: Molly Carpenter is seen today for yearly followup of CAD. She has a history of coronary disease with cardiac catheterization in 2002 showing moderate stenosis in a septal perforator branch.. She did have a nuclear stress test in August of 2012 which showed a fixed anterior apical and septal defect most likely related to left bundle branch block. There was no ischemia. Ejection fraction was 53%. She has a chronic left bundle branch block. She also has a history of hypertension, diabetes, and dyslipidemia. On followup today she reports she is doing very well. She has no significant chest pain or dyspnea. She reports her blood sugars and blood pressure have been under good control. Last A1c was 6.1%. She states her diabetic meds were reduced due to hypoglycemia.   Current Outpatient Prescriptions on File Prior to Visit  Medication Sig Dispense Refill  . aspirin 81 MG tablet Take 81 mg by mouth daily.      . cholecalciferol (VITAMIN D) 1000 UNITS tablet Take 2,000 Units by mouth daily.      . colesevelam (WELCHOL) 625 MG tablet Take 1,875 mg by mouth 2 (two) times daily with a meal.      . ezetimibe-simvastatin (VYTORIN) 10-40 MG per tablet Take 1 tablet by mouth at bedtime.      . sitaGLIPtan-metformin (JANUMET) 50-500 MG per tablet Take 1 tablet by mouth 2 (two) times daily with a meal. 50/1000 MG    . telmisartan (MICARDIS) 40 MG tablet Take 40 mg by mouth daily. ( ON HOLD )     No current facility-administered medications on file prior to visit.    No Known Allergies  Past Medical History  Diagnosis Date  . Coronary artery disease     LHC 10/2000: dLAD 70-80% after a second septal perforator, EF 50%.  She was treated medically; Nuclear study 09/2010: EF 53%, fixed distal anterior, apical and septal defect most likely related to LBBB, no ischemia.   Marland Kitchen LBBB (left bundle branch block)   . Diabetes mellitus   .  Hyperlipidemia   . Hypertension   . Chest tightness   . Hypotension     Past Surgical History  Procedure Laterality Date  . Cardiac catheterization  11/21/2000    EF 55%. sHOWED A 70-80% STENOSIS IN THE SECOND SEPTAL PERFORATOR BRANCH.  Marland Kitchen Cesarean section    . Rotator cuff repair    . US echocardiography  09/25/2008    EF 50%  . Cardiovascular stress test  03/30/2006    EF 50%  . Abdominal hysterectomy      History  Smoking status  . Never Smoker   Smokeless tobacco  . Never Used    History  Alcohol Use No    Family History  Problem Relation Age of Onset  . Heart attack Mother   . Stroke Father   . Diabetes Sister   . Hypertension Sister   . Diabetes Brother   . Heart attack Brother   . Diabetes Brother     Review of Systems: As noted in history of present illness. All other systems were reviewed and are negative.  Physical Exam: BP 122/78 mmHg  Pulse 70  Ht 5\' 4"  (1.626 m)  Wt 152 lb 14.4 oz (69.355 kg)  BMI 26.23 kg/m2 She is a pleasant,  white female in no acute distress. HEENT exam is normal. Neck is supple no JVD, adenopathy, thyromegaly, or bruits. Lungs are clear. Cardiac  exam reveals a regular rate and rhythm without gallop, murmur, or click. Abdomen is nontender. She has no edema. Pedal pulses are good. Skin is warm and dry. Neurologic exam is nonfocal.  LABORATORY DATA: ECG today demonstrates normal sinus rhythm with left axis deviation and a left bundle branch block. This is unchanged. I have personally reviewed and interpreted this study.   Assessment / Plan: 1. History of mild coronary disease. Patient is asymptomatic. Continue risk factor modification. Follow up in one year.  2. Hypertension-controlled.  3. Diabetes mellitus type 2.  4. Dyslipidemia. On Welchol and Vytorin. Labs followed by Dr. Brigitte Pulse.

## 2014-02-04 NOTE — Patient Instructions (Signed)
Continue your current therapy  I will see you in one year   

## 2014-03-10 ENCOUNTER — Other Ambulatory Visit: Payer: Self-pay

## 2014-03-10 DIAGNOSIS — Z1231 Encounter for screening mammogram for malignant neoplasm of breast: Secondary | ICD-10-CM

## 2014-03-18 ENCOUNTER — Ambulatory Visit
Admission: RE | Admit: 2014-03-18 | Discharge: 2014-03-18 | Disposition: A | Discharge status: Home / Self Care | Payer: 59 | Source: Ambulatory Visit

## 2014-03-18 DIAGNOSIS — Z1231 Encounter for screening mammogram for malignant neoplasm of breast: Secondary | ICD-10-CM

## 2015-03-03 ENCOUNTER — Other Ambulatory Visit: Payer: Self-pay

## 2015-03-03 DIAGNOSIS — Z1231 Encounter for screening mammogram for malignant neoplasm of breast: Secondary | ICD-10-CM

## 2015-03-04 ENCOUNTER — Ambulatory Visit (INDEPENDENT_AMBULATORY_CARE_PROVIDER_SITE_OTHER): Payer: 59 | Admitting: Cardiology

## 2015-03-04 ENCOUNTER — Encounter: Payer: Self-pay | Admitting: Cardiology

## 2015-03-04 VITALS — BP 130/60 | HR 70 | Ht 63.5 in | Wt 151.9 lb

## 2015-03-04 DIAGNOSIS — I10 Essential (primary) hypertension: Secondary | ICD-10-CM

## 2015-03-04 DIAGNOSIS — E785 Hyperlipidemia, unspecified: Secondary | ICD-10-CM

## 2015-03-04 DIAGNOSIS — I447 Left bundle-branch block, unspecified: Secondary | ICD-10-CM

## 2015-03-04 DIAGNOSIS — E119 Type 2 diabetes mellitus without complications: Secondary | ICD-10-CM

## 2015-03-04 DIAGNOSIS — I2510 Atherosclerotic heart disease of native coronary artery without angina pectoris: Secondary | ICD-10-CM

## 2015-03-04 NOTE — Patient Instructions (Signed)
Continue your current therapy  I will see you in one year   

## 2015-03-04 NOTE — Progress Notes (Signed)
[place] Date of Birth: [birth year]   History of Present Illness: Molly Carpenter is seen today for yearly followup of CAD. She has a history of coronary disease with cardiac catheterization in 2002 showing moderate stenosis in a septal perforator branch.. She did have a nuclear stress test in August of 2012 which showed a fixed anterior apical and septal defect most likely related to left bundle branch block. There was no ischemia. Ejection fraction was 53%. She has a chronic left bundle branch block. She also has a history of hypertension, diabetes, and dyslipidemia.  On followup today she reports she is doing very well. She has no significant chest pain or dyspnea. She reports her blood sugars and blood pressure have been under good control. She is exercising twice a week at the gym with a personal trainer. She is scheduled for lab work with Dr. Brigitte Pulse next week.  Current Outpatient Prescriptions on File Prior to Visit  Medication Sig Dispense Refill  . aspirin 81 MG tablet Take 81 mg by mouth daily.      . cholecalciferol (VITAMIN D) 1000 UNITS tablet Take 2,000 Units by mouth daily.      . colesevelam (WELCHOL) 625 MG tablet Take 1,875 mg by mouth 2 (two) times daily with a meal.      . ezetimibe-simvastatin (VYTORIN) 10-40 MG per tablet Take 1 tablet by mouth at bedtime.      . sitaGLIPtan-metformin (JANUMET) 50-500 MG per tablet Take 1 tablet by mouth 2 (two) times daily with a meal. 50/1000 MG    . telmisartan (MICARDIS) 40 MG tablet Take 40 mg by mouth daily. ( ON HOLD )     No current facility-administered medications on file prior to visit.    No Known Allergies  Past Medical History  Diagnosis Date  . Coronary artery disease     LHC 10/2000: dLAD 70-80% after a second septal perforator, EF 50%.  She was treated medically; Nuclear study 09/2010: EF 53%, fixed distal anterior, apical and septal defect most likely related to LBBB, no ischemia.   Marland Kitchen LBBB (left bundle branch block)    . Diabetes mellitus   . Hyperlipidemia   . Hypertension   . Chest tightness   . Hypotension     Past Surgical History  Procedure Laterality Date  . Cardiac catheterization  11/21/2000    EF 55%. sHOWED A 70-80% STENOSIS IN THE SECOND SEPTAL PERFORATOR BRANCH.  Marland Kitchen Cesarean section    . Rotator cuff repair    . US echocardiography  09/25/2008    EF 50%  . Cardiovascular stress test  03/30/2006    EF 50%  . Abdominal hysterectomy      History  Smoking status  . Never Smoker   Smokeless tobacco  . Never Used    History  Alcohol Use No    Family History  Problem Relation Age of Onset  . Heart attack Mother   . Stroke Father   . Diabetes Sister   . Hypertension Sister   . Diabetes Brother   . Heart attack Brother   . Diabetes Brother     Review of Systems: As noted in history of present illness. All other systems were reviewed and are negative.  Physical Exam: BP 130/60 mmHg  Pulse 70  Ht 5' 3.5" (1.613 m)  Wt 68.901 kg (151 lb 14.4 oz)  BMI 26.48 kg/m2 She is a pleasant,  white female in no acute distress. HEENT exam is normal. Neck is supple  no JVD, adenopathy, thyromegaly, or bruits. Lungs are clear. Cardiac exam reveals a regular rate and rhythm without gallop, murmur, or click. Abdomen is nontender. She has no edema. Pedal pulses are good. Skin is warm and dry. Neurologic exam is nonfocal.  LABORATORY DATA: ECG today demonstrates normal sinus rhythm with left axis deviation and a left bundle branch block. This is unchanged. I have personally reviewed and interpreted this study.   Assessment / Plan: 1. History of mild coronary disease. Patient is asymptomatic. Continue risk factor modification. Follow up in one year.  2. Hypertension-controlled.  3. Diabetes mellitus type 2.  4. Dyslipidemia. On Welchol and Vytorin. Labs followed by Dr. Brigitte Pulse.

## 2015-03-23 ENCOUNTER — Ambulatory Visit: Payer: 59

## 2015-11-20 ENCOUNTER — Other Ambulatory Visit: Payer: Self-pay | Admitting: Family Medicine

## 2015-11-20 DIAGNOSIS — Z1231 Encounter for screening mammogram for malignant neoplasm of breast: Secondary | ICD-10-CM

## 2015-12-02 ENCOUNTER — Ambulatory Visit
Admission: RE | Admit: 2015-12-02 | Discharge: 2015-12-02 | Disposition: A | Discharge status: Home / Self Care | Payer: 59 | Source: Ambulatory Visit | Attending: Family Medicine | Admitting: Family Medicine

## 2015-12-02 DIAGNOSIS — Z1231 Encounter for screening mammogram for malignant neoplasm of breast: Secondary | ICD-10-CM

## 2016-03-01 NOTE — Progress Notes (Signed)
[employer] Date of Birth: [birth year]   History of Present Illness: Molly Carpenter is seen today for yearly followup of CAD. She has a history of coronary disease with cardiac catheterization in 2002 showing moderate stenosis in a septal perforator branch.. She did have a nuclear stress test in August of 2012 which showed a fixed anterior apical and septal defect most likely related to left bundle branch block. There was no ischemia. Ejection fraction was 53%. She has a chronic left bundle branch block. She also has a history of hypertension, diabetes, and dyslipidemia.  On followup today she reports she is doing very well. She has no significant chest pain or dyspnea. She is exercising three times a week at the gym.   Current Outpatient Prescriptions on File Prior to Visit  Medication Sig Dispense Refill  . aspirin 81 MG tablet Take 81 mg by mouth daily.      . cholecalciferol (VITAMIN D) 1000 UNITS tablet Take 2,000 Units by mouth daily.      . colesevelam (WELCHOL) 625 MG tablet Take 1,875 mg by mouth 2 (two) times daily with a meal.      . sitaGLIPtan-metformin (JANUMET) 50-500 MG per tablet Take 1 tablet by mouth 2 (two) times daily with a meal. 50/1000 MG     No current facility-administered medications on file prior to visit.     No Known Allergies  Past Medical History:  Diagnosis Date  . Chest tightness   . Coronary artery disease    LHC 10/2000: dLAD 70-80% after a second septal perforator, EF 50%.  She was treated medically; Nuclear study 09/2010: EF 53%, fixed distal anterior, apical and septal defect most likely related to LBBB, no ischemia.   . Diabetes mellitus   . Hyperlipidemia   . Hypertension   . Hypotension   . LBBB (left bundle branch block)     Past Surgical History:  Procedure Laterality Date  . ABDOMINAL HYSTERECTOMY    . CARDIAC CATHETERIZATION  11/21/2000   EF 55%. sHOWED A 70-80% STENOSIS IN THE SECOND SEPTAL PERFORATOR BRANCH.  Marland Kitchen CARDIOVASCULAR  STRESS TEST  03/30/2006   EF 50%  . CESAREAN SECTION    . ROTATOR CUFF REPAIR    . US ECHOCARDIOGRAPHY  09/25/2008   EF 50%    History  Smoking Status  . Never Smoker  Smokeless Tobacco  . Never Used    History  Alcohol Use No    Family History  Problem Relation Age of Onset  . Heart attack Mother   . Stroke Father   . Diabetes Sister   . Hypertension Sister   . Diabetes Brother   . Heart attack Brother   . Diabetes Brother     Review of Systems: As noted in history of present illness. All other systems were reviewed and are negative.  Physical Exam: BP 128/68   Pulse 75   Ht 5\' 4"  (1.626 m)   Wt 149 lb 6 oz (67.8 kg)   BMI 25.64 kg/m  She is a pleasant,  white female in no acute distress. HEENT exam is normal. Neck is supple no JVD, adenopathy, thyromegaly, or bruits. Lungs are clear. Cardiac exam reveals a regular rate and rhythm without gallop, murmur, or click. Abdomen is nontender. She has no edema. Pedal pulses are good. Skin is warm and dry. Neurologic exam is nonfocal.  LABORATORY DATA: ECG today demonstrates normal sinus rhythm with left axis deviation and a left bundle branch block. This is unchanged.  I have personally reviewed and interpreted this study.  Labs dated 10/23/15: cholesterol 105, triglycerides 185, LDL 28, HDL 40. A1c 6.6%. CMET normal.  Assessment / Plan: 1. History of mild coronary disease. Patient is asymptomatic. Continue risk factor modification. Follow up in one year.  2. Hypertension- well controlled.  3. Diabetes mellitus type 2.  4. Dyslipidemia. On Welchol and Vytorin. Excellent control.

## 2016-03-04 ENCOUNTER — Encounter: Payer: Self-pay | Admitting: Cardiology

## 2016-03-04 ENCOUNTER — Ambulatory Visit (INDEPENDENT_AMBULATORY_CARE_PROVIDER_SITE_OTHER): Payer: Medicare Other | Admitting: Cardiology

## 2016-03-04 VITALS — BP 128/68 | HR 75 | Ht 64.0 in | Wt 149.4 lb

## 2016-03-04 DIAGNOSIS — I10 Essential (primary) hypertension: Secondary | ICD-10-CM

## 2016-03-04 DIAGNOSIS — E7800 Pure hypercholesterolemia, unspecified: Secondary | ICD-10-CM | POA: Diagnosis not present

## 2016-03-04 DIAGNOSIS — I447 Left bundle-branch block, unspecified: Secondary | ICD-10-CM | POA: Diagnosis not present

## 2016-03-04 DIAGNOSIS — I2510 Atherosclerotic heart disease of native coronary artery without angina pectoris: Secondary | ICD-10-CM | POA: Diagnosis not present

## 2016-03-04 NOTE — Patient Instructions (Signed)
Continue your current therapy  I will see you in one year   

## 2016-03-04 NOTE — Addendum Note (Signed)
Addended by: Kathyrn Lass on: 03/04/2016 08:27 AM   Modules accepted: Orders

## 2016-03-27 ENCOUNTER — Ambulatory Visit (HOSPITAL_COMMUNITY)
Admission: EM | Admit: 2016-03-27 | Discharge: 2016-03-27 | Disposition: A | Discharge status: Home / Self Care | Payer: Medicare Other | Attending: Family Medicine | Admitting: Family Medicine

## 2016-03-27 ENCOUNTER — Encounter (HOSPITAL_COMMUNITY): Payer: Self-pay

## 2016-03-27 DIAGNOSIS — H01113 Allergic dermatitis of right eye, unspecified eyelid: Secondary | ICD-10-CM | POA: Diagnosis not present

## 2016-03-27 MED ORDER — MOMETASONE FUROATE 0.1 % EX OINT: TOPICAL_OINTMENT | Freq: Every day | CUTANEOUS | 0 refills | Status: DC

## 2016-03-27 NOTE — ED Triage Notes (Signed)
Pt said she woke up Friday morning and her right eye was stinging and swollen. Said it does not itch or water. Did put hot compresses on it.

## 2016-03-27 NOTE — ED Provider Notes (Signed)
[place]    CSN: [id number] Arrival date & time: 03/27/16  1208     History   Chief Complaint Chief Complaint  Patient presents with  . Conjunctivitis    HPI Molly Carpenter is a 66 y.o. female.   The history is provided by the patient.  Eye Problem  Location:  Right eye Quality:  Burning Severity:  Mild Onset quality:  Gradual Duration:  2 days Progression:  Unchanged Chronicity:  New Context comment:  Onset fri am , no known contact. Ineffective treatments: using warm compress. Associated symptoms: itching   Associated symptoms: no discharge, no photophobia and no redness   Associated symptoms comment:  No ocular sx, sx related to upper eyelid.   Past Medical History:  Diagnosis Date  . Chest tightness   . Coronary artery disease    LHC 10/2000: dLAD 70-80% after a second septal perforator, EF 50%.  She was treated medically; Nuclear study 09/2010: EF 53%, fixed distal anterior, apical and septal defect most likely related to LBBB, no ischemia.   . Diabetes mellitus   . Hyperlipidemia   . Hypertension   . Hypotension   . LBBB (left bundle branch block)     Patient Active Problem List   Diagnosis Date Noted  . Hypotension 09/27/2011  . Diabetes mellitus, type II ([place]) 09/24/2010  . Coronary artery disease   . LBBB (left bundle branch block)   . Hyperlipidemia   . Hypertension     Past Surgical History:  Procedure Laterality Date  . ABDOMINAL HYSTERECTOMY    . CARDIAC CATHETERIZATION  11/21/2000   EF 55%. sHOWED A 70-80% STENOSIS IN THE SECOND SEPTAL PERFORATOR BRANCH.  Marland Kitchen CARDIOVASCULAR STRESS TEST  03/30/2006   EF 50%  . CESAREAN SECTION    . ROTATOR CUFF REPAIR    . US ECHOCARDIOGRAPHY  09/25/2008   EF 50%    OB History    No data available       Home Medications    Prior to Admission medications   Medication Sig Start Date End Date Taking? Authorizing Provider  aspirin 81 MG tablet Take 81 mg by mouth daily.     Yes  Historical Provider, MD  cholecalciferol (VITAMIN D) 1000 UNITS tablet Take 2,000 Units by mouth daily.     Yes Historical Provider, MD  colesevelam (WELCHOL) 625 MG tablet Take 1,875 mg by mouth 2 (two) times daily with a meal.     Yes Historical Provider, MD  ezetimibe-simvastatin (VYTORIN) 10-40 MG tablet Take 1/2 tablet every night 03/04/16  Yes Peter M Martinique, MD  sitaGLIPtan-metformin (JANUMET) 50-500 MG per tablet Take 1 tablet by mouth 2 (two) times daily with a meal. 50/1000 MG   Yes Historical Provider, MD  telmisartan (MICARDIS) 20 MG tablet Take 1 tablet (20 mg total) by mouth daily. 03/04/16  Yes Peter M Martinique, MD  mometasone (ELOCON) 0.1 % ointment Apply topically daily. 03/27/16   Billy Fischer, MD    Family History Family History  Problem Relation Age of Onset  . Heart attack Mother   . Stroke Father   . Diabetes Sister   . Hypertension Sister   . Diabetes Brother   . Heart attack Brother   . Diabetes Brother     Social History Social History  Substance Use Topics  . Smoking status: Never Smoker  . Smokeless tobacco: Never Used  . Alcohol use No     Allergies   Patient has no known allergies.  Review of Systems Review of Systems  Constitutional: Negative.   HENT: Negative.   Eyes: Positive for itching. Negative for photophobia, pain, discharge, redness and visual disturbance.  All other systems reviewed and are negative.    Physical Exam Triage Vital Signs ED Triage Vitals  Enc Vitals Group     BP 03/27/16 1256 125/68     Pulse Rate 03/27/16 1256 74     Resp 03/27/16 1256 20     Temp 03/27/16 1256 98.3 F (36.8 C)     Temp Source 03/27/16 1256 Oral     SpO2 03/27/16 1256 99 %     Weight --      Height --      Head Circumference --      Peak Flow --      Pain Score 03/27/16 1259 0     Pain Loc --      Pain Edu? --      Excl. in [place]? --    No data found.   Updated Vital Signs BP 125/68 (BP Location: Left Arm)   Pulse 74   Temp 98.3 F  (36.8 C) (Oral)   Resp 20   SpO2 99%   Visual Acuity Right Eye Distance:   Left Eye Distance:   Bilateral Distance:    Right Eye Near:   Left Eye Near:    Bilateral Near:     Physical Exam  Constitutional: She appears well-developed and well-nourished.  HENT:  Right Ear: External ear normal.  Left Ear: External ear normal.  Mouth/Throat: Oropharynx is clear and moist.  Eyes: Conjunctivae and EOM are normal. Pupils are equal, round, and reactive to light.    Nursing note and vitals reviewed.    UC Treatments / Results  Labs (all labs ordered are listed, but only abnormal results are displayed) Labs Reviewed - No data to display  EKG  EKG Interpretation None       Radiology No results found.  Procedures Procedures (including critical care time)  Medications Ordered in UC Medications - No data to display   Initial Impression / Assessment and Plan / UC Course  I have reviewed the triage vital signs and the nursing notes.  Pertinent labs & imaging results that were available during my care of the patient were reviewed by me and considered in my medical decision making (see chart for details).       Final Clinical Impressions(s) / UC Diagnoses   Final diagnoses:  Eyelid dermatitis, allergic/contact, right    New Prescriptions Discharge Medication List as of 03/27/2016  1:29 PM    START taking these medications   Details  mometasone (ELOCON) 0.1 % ointment Apply topically daily., Starting Sun 03/27/2016, Normal         Billy Fischer, MD 03/31/16 [phone]

## 2016-06-27 ENCOUNTER — Ambulatory Visit: Payer: Self-pay | Admitting: Allergy and Immunology

## 2016-10-25 ENCOUNTER — Other Ambulatory Visit: Payer: Self-pay | Admitting: Family Medicine

## 2016-10-25 DIAGNOSIS — Z1231 Encounter for screening mammogram for malignant neoplasm of breast: Secondary | ICD-10-CM

## 2016-12-02 ENCOUNTER — Ambulatory Visit
Admission: RE | Admit: 2016-12-02 | Discharge: 2016-12-02 | Disposition: A | Discharge status: Home / Self Care | Payer: Medicare Other | Source: Ambulatory Visit | Attending: Family Medicine | Admitting: Family Medicine

## 2016-12-02 DIAGNOSIS — Z1231 Encounter for screening mammogram for malignant neoplasm of breast: Secondary | ICD-10-CM

## 2017-03-22 NOTE — Progress Notes (Signed)
[place] Date of Birth: January 19, 1951   History of Present Illness: Molly Carpenter is seen today for yearly followup of CAD. She has a history of coronary disease with cardiac catheterization in 2002 showing moderate stenosis in a septal perforator branch.. She did have a nuclear stress test in August of 2012 which showed a fixed anterior apical and septal defect most likely related to left bundle branch block. There was no ischemia. Ejection fraction was 53%. She has a chronic left bundle branch block. She also has a history of hypertension, diabetes, and dyslipidemia.  On followup today she  is doing very well. She denies any chest pain, SOB, palpitations, edema. She goes to the gym three days a week. Sugars are doing OK.   Current Outpatient Medications on File Prior to Visit  Medication Sig Dispense Refill  . aspirin 81 MG tablet Take 81 mg by mouth daily.      . cholecalciferol (VITAMIN D) 1000 UNITS tablet Take 2,000 Units by mouth daily.      . colesevelam (WELCHOL) 625 MG tablet Take 1,875 mg by mouth 2 (two) times daily with a meal.      . ezetimibe-simvastatin (VYTORIN) 10-40 MG tablet Take 1/2 tablet every night 30 tablet 6  . sitaGLIPtan-metformin (JANUMET) 50-500 MG per tablet Take 1 tablet by mouth 2 (two) times daily with a meal. 50/1000 MG    . telmisartan (MICARDIS) 20 MG tablet Take 1 tablet (20 mg total) by mouth daily. 30 tablet 6  . mometasone (ELOCON) 0.1 % ointment Apply topically daily. 15 g 0   No current facility-administered medications on file prior to visit.     No Known Allergies  Past Medical History:  Diagnosis Date  . Chest tightness   . Coronary artery disease    LHC 10/2000: dLAD 70-80% after a second septal perforator, EF 50%.  She was treated medically; Nuclear study 09/2010: EF 53%, fixed distal anterior, apical and septal defect most likely related to LBBB, no ischemia.   . Diabetes mellitus   . Hyperlipidemia   . Hypertension   . Hypotension    . LBBB (left bundle branch block)     Past Surgical History:  Procedure Laterality Date  . ABDOMINAL HYSTERECTOMY    . CARDIAC CATHETERIZATION  11/21/2000   EF 55%. sHOWED A 70-80% STENOSIS IN THE SECOND SEPTAL PERFORATOR BRANCH.  Marland Kitchen CARDIOVASCULAR STRESS TEST  03/30/2006   EF 50%  . CESAREAN SECTION    . ROTATOR CUFF REPAIR    . US ECHOCARDIOGRAPHY  09/25/2008   EF 50%    Social History   Tobacco Use  Smoking Status Never Smoker  Smokeless Tobacco Never Used    Social History   Substance and Sexual Activity  Alcohol Use No  . Alcohol/week: 0.0 oz    Family History  Problem Relation Age of Onset  . Heart attack Mother   . Stroke Father   . Diabetes Sister   . Hypertension Sister   . Diabetes Brother   . Heart attack Brother   . Diabetes Brother   . Breast cancer Neg Hx     Review of Systems: As noted in history of present illness. All other systems were reviewed and are negative.  Physical Exam: BP 118/66   Pulse 70   Ht 5\' 4"  (1.626 m)   Wt 152 lb (68.9 kg)   BMI 26.09 kg/m  GENERAL:  Well appearing WF HEENT:  PERRL, EOMI, sclera are clear. Oropharynx is clear.  NECK:  No jugular venous distention, carotid upstroke brisk and symmetric, no bruits, no thyromegaly or adenopathy LUNGS:  Clear to auscultation bilaterally CHEST:  Unremarkable HEART:  RRR,  PMI not displaced or sustained,S1 and S2 within normal limits, no S3, no S4: no clicks, no rubs, no murmurs ABD:  Soft, nontender. BS +, no masses or bruits. No hepatomegaly, no splenomegaly EXT:  2 + pulses throughout, no edema, no cyanosis no clubbing SKIN:  Warm and dry.  No rashes NEURO:  Alert and oriented x 3. Cranial nerves II through XII intact. PSYCH:  Cognitively intact    LABORATORY DATA: ECG today demonstrates normal sinus rhythm with left axis deviation and a left bundle branch block. Stable. I have personally reviewed and interpreted this study.   Labs dated 10/23/15: cholesterol 105,  triglycerides 185, LDL 28, HDL 40. A1c 6.6%. CMET normal. Dated 11/14/16: A1c 7%. Creatine 1.1.   Assessment / Plan: 1. History of mild coronary disease. Patient is asymptomatic. ECG is unchanged. Continue risk factor modification. Follow up in one year. I wouldn't recommend follow up stress testing unless she is having some symptoms.   2. Hypertension- well controlled.  3. Diabetes mellitus type 2.  4. Dyslipidemia. On Welchol and Vytorin. Excellent control.  5. LBBB chronic

## 2017-03-28 ENCOUNTER — Encounter (INDEPENDENT_AMBULATORY_CARE_PROVIDER_SITE_OTHER): Payer: Self-pay

## 2017-03-28 ENCOUNTER — Encounter: Payer: Self-pay | Admitting: Cardiology

## 2017-03-28 ENCOUNTER — Ambulatory Visit: Payer: Medicare Other | Admitting: Cardiology

## 2017-03-28 VITALS — BP 118/66 | HR 70 | Ht 64.0 in | Wt 152.0 lb

## 2017-03-28 DIAGNOSIS — I2510 Atherosclerotic heart disease of native coronary artery without angina pectoris: Secondary | ICD-10-CM

## 2017-03-28 DIAGNOSIS — E7800 Pure hypercholesterolemia, unspecified: Secondary | ICD-10-CM

## 2017-03-28 DIAGNOSIS — I447 Left bundle-branch block, unspecified: Secondary | ICD-10-CM

## 2017-03-28 NOTE — Patient Instructions (Addendum)
Continue your current therapy  I will see you in one year   

## 2017-10-25 ENCOUNTER — Other Ambulatory Visit: Payer: Self-pay | Admitting: Family Medicine

## 2017-10-25 DIAGNOSIS — Z1231 Encounter for screening mammogram for malignant neoplasm of breast: Secondary | ICD-10-CM

## 2017-12-05 ENCOUNTER — Ambulatory Visit
Admission: RE | Admit: 2017-12-05 | Discharge: 2017-12-05 | Disposition: A | Discharge status: Home / Self Care | Payer: Medicare Other | Source: Ambulatory Visit | Attending: Family Medicine | Admitting: Family Medicine

## 2017-12-05 DIAGNOSIS — Z1231 Encounter for screening mammogram for malignant neoplasm of breast: Secondary | ICD-10-CM

## 2018-03-15 ENCOUNTER — Encounter: Payer: Self-pay | Admitting: Cardiology

## 2018-03-29 NOTE — Progress Notes (Signed)
[place] Date of Birth: 1950-09-01   History of Present Illness: Molly Carpenter is seen today for yearly followup of CAD. She has a history of coronary disease with cardiac catheterization in 2002 showing moderate stenosis in a septal perforator branch.. She did have a nuclear stress test in August of 2012 which showed a fixed anterior apical and septal defect most likely related to left bundle branch block. There was no ischemia. Ejection fraction was 53%. She has a chronic left bundle branch block. She also has a history of hypertension, diabetes, and dyslipidemia.   On followup today she  is doing very well. She notes 2 weeks ago she was rushing across a road and developed chest pain and dyspnea. This resolved shortly after. She has none since. Goes to the gym 3x/week. Feels good generally. BS 120-126. BP well controlled.   Current Outpatient Medications on File Prior to Visit  Medication Sig Dispense Refill  . aspirin 81 MG tablet Take 81 mg by mouth daily.      . cholecalciferol (VITAMIN D) 1000 UNITS tablet Take 2,000 Units by mouth daily.      . colesevelam (WELCHOL) 625 MG tablet Take 1,875 mg by mouth 2 (two) times daily with a meal.      . Exenatide ER (BYDUREON BCISE) 2 MG/0.85ML AUIJ Inject 2 mg into the skin once a week.    . ezetimibe-simvastatin (VYTORIN) 10-40 MG tablet Take 1/2 tablet every night 30 tablet 6  . sitaGLIPtan-metformin (JANUMET) 50-500 MG per tablet Take 1 tablet by mouth 2 (two) times daily with a meal. 50/1000 MG    . telmisartan (MICARDIS) 20 MG tablet Take 1 tablet (20 mg total) by mouth daily. 30 tablet 6   No current facility-administered medications on file prior to visit.     No Known Allergies  Past Medical History:  Diagnosis Date  . Chest tightness   . Coronary artery disease    LHC 10/2000: dLAD 70-80% after a second septal perforator, EF 50%.  She was treated medically; Nuclear study 09/2010: EF 53%, fixed distal anterior, apical and  septal defect most likely related to LBBB, no ischemia.   . Diabetes mellitus   . Hyperlipidemia   . Hypertension   . Hypotension   . LBBB (left bundle branch block)     Past Surgical History:  Procedure Laterality Date  . ABDOMINAL HYSTERECTOMY    . CARDIAC CATHETERIZATION  11/21/2000   EF 55%. sHOWED A 70-80% STENOSIS IN THE SECOND SEPTAL PERFORATOR BRANCH.  Marland Kitchen CARDIOVASCULAR STRESS TEST  03/30/2006   EF 50%  . CESAREAN SECTION    . ROTATOR CUFF REPAIR    . US ECHOCARDIOGRAPHY  09/25/2008   EF 50%    Social History   Tobacco Use  Smoking Status Never Smoker  Smokeless Tobacco Never Used    Social History   Substance and Sexual Activity  Alcohol Use No  . Alcohol/week: 0.0 standard drinks    Family History  Problem Relation Age of Onset  . Heart attack Mother   . Stroke Father   . Diabetes Sister   . Hypertension Sister   . Diabetes Brother   . Heart attack Brother   . Diabetes Brother   . Breast cancer Neg Hx     Review of Systems: As noted in history of present illness. All other systems were reviewed and are negative.  Physical Exam: BP 120/68   Pulse 81   Ht 5\' 4"  (1.626 m)  Wt 151 lb 12.8 oz (68.9 kg)   BMI 26.06 kg/m  GENERAL:  Well appearing WF in NAD HEENT:  PERRL, EOMI, sclera are clear. Oropharynx is clear. NECK:  No jugular venous distention, carotid upstroke brisk and symmetric, no bruits, no thyromegaly or adenopathy LUNGS:  Clear to auscultation bilaterally CHEST:  Unremarkable HEART:  RRR,  PMI not displaced or sustained,S1 and S2 within normal limits, no S3, no S4: no clicks, no rubs, no murmurs ABD:  Soft, nontender. BS +, no masses or bruits. No hepatomegaly, no splenomegaly EXT:  2 + pulses throughout, no edema, no cyanosis no clubbing SKIN:  Warm and dry.  No rashes NEURO:  Alert and oriented x 3. Cranial nerves II through XII intact. PSYCH:  Cognitively intact      LABORATORY DATA: ECG today demonstrates normal sinus rhythm  with left axis deviation and a left bundle branch block. Occ. PVC.  I have personally reviewed and interpreted this study.   Labs dated 10/23/15: cholesterol 105, triglycerides 185, LDL 28, HDL 40. A1c 6.6%. CMET normal. Dated 11/14/16: A1c 7%. Creatine 1.1.  Dated 12/06/17: cholesterol 153, triglycerides 211, HDL 47, LDL 64. A1c 7.1%. chemistries normal.   Assessment / Plan: 1. History of mild coronary disease. Patient has a single episode of CP. ECG is unchanged. Continue risk factor modification. She is otherwise quite active. Will let me know if she has any further chest pain- if so would consider a Lexiscan myoview.   2. Hypertension- well controlled.  3. Diabetes mellitus type 2.  4. Dyslipidemia. On Welchol and Vytorin. Excellent control.  5. LBBB chronic

## 2018-04-02 ENCOUNTER — Encounter (INDEPENDENT_AMBULATORY_CARE_PROVIDER_SITE_OTHER): Payer: Self-pay

## 2018-04-02 ENCOUNTER — Encounter: Payer: Self-pay | Admitting: Cardiology

## 2018-04-02 ENCOUNTER — Ambulatory Visit: Payer: Medicare Other | Admitting: Cardiology

## 2018-04-02 VITALS — BP 120/68 | HR 81 | Ht 64.0 in | Wt 151.8 lb

## 2018-04-02 DIAGNOSIS — I447 Left bundle-branch block, unspecified: Secondary | ICD-10-CM

## 2018-04-02 DIAGNOSIS — I2510 Atherosclerotic heart disease of native coronary artery without angina pectoris: Secondary | ICD-10-CM

## 2018-04-02 DIAGNOSIS — E7800 Pure hypercholesterolemia, unspecified: Secondary | ICD-10-CM | POA: Diagnosis not present

## 2018-04-02 NOTE — Addendum Note (Signed)
Addended by: Diana Eves on: 04/02/2018 08:29 AM   Modules accepted: Orders

## 2018-12-26 ENCOUNTER — Other Ambulatory Visit: Payer: Self-pay | Admitting: Family Medicine

## 2018-12-26 DIAGNOSIS — Z1231 Encounter for screening mammogram for malignant neoplasm of breast: Secondary | ICD-10-CM

## 2019-02-18 ENCOUNTER — Ambulatory Visit: Payer: Medicare Other

## 2019-02-26 ENCOUNTER — Ambulatory Visit: Payer: Medicare Other

## 2019-03-27 ENCOUNTER — Ambulatory Visit
Admission: RE | Admit: 2019-03-27 | Discharge: 2019-03-27 | Disposition: A | Discharge status: Home / Self Care | Payer: Medicare Other | Source: Ambulatory Visit | Attending: Family Medicine | Admitting: Family Medicine

## 2019-03-27 ENCOUNTER — Other Ambulatory Visit: Payer: Self-pay

## 2019-03-27 DIAGNOSIS — Z1231 Encounter for screening mammogram for malignant neoplasm of breast: Secondary | ICD-10-CM

## 2019-05-16 NOTE — Progress Notes (Signed)
[place] Date of Birth: Sep 06, 1950   History of Present Illness: Molly Carpenter is seen today for yearly followup of CAD. She has a history of coronary disease with cardiac catheterization in 2002 showing moderate stenosis in a septal perforator branch.. She did have a nuclear stress test in August of 2012 which showed a fixed anterior apical and septal defect most likely related to left bundle branch block. There was no ischemia. Ejection fraction was 53%. She has a chronic left bundle branch block. She also has a history of hypertension, diabetes, and dyslipidemia.   On followup today she  is doing OK. She has gained 10 lbs in the past year due to being in with the pandemic and more sedentary. She started exercising 3 months ago and notes more shortness of breath on exertion. No chest pain. Does note occasional sharp pain behind right ear.  Reports BP at home typically 124/60. Sugars have been poorly controlled. Trying tresiba but hasn't noted any improvement. Is going to [facility] to see about a new once a week injection.  Current Outpatient Medications on File Prior to Visit  Medication Sig Dispense Refill  . aspirin 81 MG tablet Take 81 mg by mouth daily.      . cholecalciferol (VITAMIN D) 1000 UNITS tablet Take 2,000 Units by mouth daily.      Marland Kitchen ezetimibe-simvastatin (VYTORIN) 10-40 MG tablet Take 1/2 tablet every night 30 tablet 6  . Insulin Degludec (TRESIBA FLEXTOUCH [place]) Inject into the skin.    . metFORMIN (GLUCOPHAGE) 500 MG tablet Take by mouth 2 (two) times daily with a meal.    . telmisartan (MICARDIS) 20 MG tablet Take 1 tablet (20 mg total) by mouth daily. 30 tablet 6   No current facility-administered medications on file prior to visit.    No Known Allergies  Past Medical History:  Diagnosis Date  . Chest tightness   . Coronary artery disease    LHC 10/2000: dLAD 70-80% after a second septal perforator, EF 50%.  She was treated medically; Nuclear study 09/2010: EF  53%, fixed distal anterior, apical and septal defect most likely related to LBBB, no ischemia.   . Diabetes mellitus   . Hyperlipidemia   . Hypertension   . Hypotension   . LBBB (left bundle branch block)     Past Surgical History:  Procedure Laterality Date  . ABDOMINAL HYSTERECTOMY    . CARDIAC CATHETERIZATION  11/21/2000   EF 55%. sHOWED A 70-80% STENOSIS IN THE SECOND SEPTAL PERFORATOR BRANCH.  Marland Kitchen CARDIOVASCULAR STRESS TEST  03/30/2006   EF 50%  . CESAREAN SECTION    . ROTATOR CUFF REPAIR    . US ECHOCARDIOGRAPHY  09/25/2008   EF 50%    Social History   Tobacco Use  Smoking Status Never Smoker  Smokeless Tobacco Never Used    Social History   Substance and Sexual Activity  Alcohol Use No  . Alcohol/week: 0.0 standard drinks    Family History  Problem Relation Age of Onset  . Heart attack Mother   . Stroke Father   . Diabetes Sister   . Hypertension Sister   . Diabetes Brother   . Heart attack Brother   . Diabetes Brother   . Breast cancer Neg Hx     Review of Systems: As noted in history of present illness. All other systems were reviewed and are negative.  Physical Exam: BP (!) 142/62   Pulse 79   Ht 5\' 4"  (1.626 m)  Wt 161 lb (73 kg)   BMI 27.64 kg/m  GENERAL:  Well appearing WF in NAD HEENT:  PERRL, EOMI, sclera are clear. Oropharynx is clear. NECK:  No jugular venous distention, carotid upstroke brisk and symmetric, no bruits, no thyromegaly or adenopathy LUNGS:  Clear to auscultation bilaterally CHEST:  Unremarkable HEART:  RRR,  PMI not displaced or sustained,S1 and S2 within normal limits, no S3, no S4: no clicks, no rubs, no murmurs ABD:  Soft, nontender. BS +, no masses or bruits. No hepatomegaly, no splenomegaly EXT:  2 + pulses throughout, no edema, no cyanosis no clubbing SKIN:  Warm and dry.  No rashes NEURO:  Alert and oriented x 3. Cranial nerves II through XII intact. PSYCH:  Cognitively intact   LABORATORY DATA: ECG today  demonstrates normal sinus rhythm with left axis deviation and a left bundle branch block. Rate 79. Occ-frequent. PVC.  I have personally reviewed and interpreted this study.   Labs dated 10/23/15: cholesterol 105, triglycerides 185, LDL 28, HDL 40. A1c 6.6%. CMET normal. Dated 11/14/16: A1c 7%. Creatine 1.1.  Dated 12/06/17: cholesterol 153, triglycerides 211, HDL 47, LDL 64. A1c 7.1%. chemistries normal. Dated 05/08/19: cholesterol 150, triglycerides 202, HDL 42, LDL 74. A1c 9.6%. creatinine 1.08, otherwise CMET normal   Assessment / Plan: 1. History of mild coronary disease. Multiple cardiac risk factors. She is having DOE which may be related to weight gain and deconditioning but also need to consider progressive CAD or LV dysfunction. Will order an Echocardiogram. Depending on results may need to consider coronary CTA.  Continue risk factor modification.  2. Hypertension- under fair control on Micardis. If she does have LV dysfunction will need to consider adding Coreg.   3. Diabetes mellitus type 2.Poorly controlled. Per primary care.   4. Dyslipidemia. On Welchol and Vytorin. LDL close to goal. Triglycerides elevated. Focus on improving diabetic control.   5. LBBB chronic  6. PVCs asymptomatic.

## 2019-05-22 ENCOUNTER — Encounter: Payer: Self-pay | Admitting: Cardiology

## 2019-05-22 ENCOUNTER — Ambulatory Visit (INDEPENDENT_AMBULATORY_CARE_PROVIDER_SITE_OTHER): Payer: Medicare Other | Admitting: Cardiology

## 2019-05-22 ENCOUNTER — Other Ambulatory Visit: Payer: Self-pay

## 2019-05-22 VITALS — BP 142/62 | HR 79 | Ht 64.0 in | Wt 161.0 lb

## 2019-05-22 DIAGNOSIS — E7800 Pure hypercholesterolemia, unspecified: Secondary | ICD-10-CM | POA: Diagnosis not present

## 2019-05-22 DIAGNOSIS — I447 Left bundle-branch block, unspecified: Secondary | ICD-10-CM | POA: Diagnosis not present

## 2019-05-22 DIAGNOSIS — R0600 Dyspnea, unspecified: Secondary | ICD-10-CM

## 2019-05-22 DIAGNOSIS — I2510 Atherosclerotic heart disease of native coronary artery without angina pectoris: Secondary | ICD-10-CM | POA: Diagnosis not present

## 2019-05-22 DIAGNOSIS — R0609 Other forms of dyspnea: Secondary | ICD-10-CM

## 2019-05-22 NOTE — Patient Instructions (Addendum)
Medication Instructions:  Continue same medications *If you need a refill on your cardiac medications before your next appointment, please call your pharmacy*   Lab Work: None ordered    Testing/Procedures: Echo   Follow-Up: At [facility], you and your health needs are our priority.  As part of our continuing mission to provide you with exceptional heart care, we have created designated Provider Care Teams.  These Care Teams include your primary Cardiologist (physician) and Advanced Practice Providers (APPs -  Physician Assistants and Nurse Practitioners) who all work together to provide you with the care you need, when you need it.  We recommend signing up for the patient portal called "MyChart".  Sign up information is provided on this After Visit Summary.  MyChart is used to connect with patients for Virtual Visits (Telemedicine).  Patients are able to view lab/test results, encounter notes, upcoming appointments, etc.  Non-urgent messages can be sent to your provider as well.   To learn more about what you can do with MyChart, go to [email or web address].    Your next appointment:  To be determined after Echo   The format for your next appointment: Office     Provider:  Dr.Jordan

## 2019-06-04 ENCOUNTER — Other Ambulatory Visit: Payer: Self-pay

## 2019-06-04 ENCOUNTER — Ambulatory Visit (HOSPITAL_COMMUNITY): Payer: Medicare Other | Attending: Cardiovascular Disease

## 2019-06-04 DIAGNOSIS — R0600 Dyspnea, unspecified: Secondary | ICD-10-CM | POA: Diagnosis present

## 2019-06-04 DIAGNOSIS — E7800 Pure hypercholesterolemia, unspecified: Secondary | ICD-10-CM | POA: Diagnosis present

## 2019-06-04 DIAGNOSIS — I2510 Atherosclerotic heart disease of native coronary artery without angina pectoris: Secondary | ICD-10-CM | POA: Insufficient documentation

## 2019-06-04 DIAGNOSIS — I447 Left bundle-branch block, unspecified: Secondary | ICD-10-CM | POA: Diagnosis present

## 2019-06-04 DIAGNOSIS — R0609 Other forms of dyspnea: Secondary | ICD-10-CM

## 2019-06-06 ENCOUNTER — Other Ambulatory Visit: Payer: Self-pay

## 2019-06-06 DIAGNOSIS — I447 Left bundle-branch block, unspecified: Secondary | ICD-10-CM

## 2019-06-06 DIAGNOSIS — R0609 Other forms of dyspnea: Secondary | ICD-10-CM

## 2019-06-06 DIAGNOSIS — I2510 Atherosclerotic heart disease of native coronary artery without angina pectoris: Secondary | ICD-10-CM

## 2019-06-06 MED ORDER — CARVEDILOL 3.125 MG PO TABS: 3.1250 mg | ORAL_TABLET | Freq: Two times a day (BID) | ORAL | 3 refills | Status: DC

## 2019-06-06 NOTE — Progress Notes (Signed)
coro

## 2019-06-07 ENCOUNTER — Telehealth: Payer: Self-pay

## 2019-06-07 DIAGNOSIS — I10 Essential (primary) hypertension: Secondary | ICD-10-CM

## 2019-06-07 DIAGNOSIS — Z01812 Encounter for preprocedural laboratory examination: Secondary | ICD-10-CM

## 2019-06-07 MED ORDER — METOPROLOL TARTRATE 100 MG PO TABS: ORAL_TABLET | ORAL | 0 refills | Status: DC

## 2019-06-07 NOTE — Telephone Encounter (Signed)
Your cardiac CT will be scheduled at one of the below locations:   [facility] [address] [place], [place] [address] [phone]  [place] [address] [place], [place] [address] [phone]  If scheduled at [facility], please arrive at the [facility] main entrance of [facility] 30 minutes prior to test start time. Proceed to the [facility] Radiology Department (first floor) to check-in and test prep.   Please follow these instructions carefully (unless otherwise directed):   On the Night Before the Test: . Be sure to Drink plenty of water. . Do not consume any caffeinated/decaffeinated beverages or chocolate 12 hours prior to your test. . Do not take any antihistamines 12 hours prior to your test. . If you take Metformin do not take 24 hours prior to test.   On the Day of the Test: . Drink plenty of water. Do not drink any water within one hour of the test. . Do not eat any food 4 hours prior to the test. . You may take your regular medications prior to the test.  . Take metoprolol 100 mg two hours prior to test. . Hold Carvedilol morning of. . Hold morning Insulin. Marland Kitchen FEMALES- please wear underwire-free bra if available         After the Test: . Drink plenty of water. . After receiving IV contrast, you may experience a mild flushed feeling. This is normal. . On occasion, you may experience a mild rash up to 24 hours after the test. This is not dangerous. If this occurs, you can take Benadryl 25 mg and increase your fluid intake. . If you experience trouble breathing, this can be serious. If it is severe call 911 IMMEDIATELY. If it is mild, please call our office. . If you take any of these medications: Glipizide/Metformin, Avandament, Glucavance, please do not take 48 hours after completing test unless otherwise instructed.   Once we have confirmed authorization from your  insurance company, we will call you to set up a date and time for your test.   For non-scheduling related questions, please contact the cardiac imaging nurse navigator should you have any questions/concerns: [name], RN Navigator Cardiac Imaging [facility] [phone] office  For scheduling needs, including cancellations and rescheduling, please call [phone].

## 2019-06-12 LAB — BASIC METABOLIC PANEL
BUN/Creatinine Ratio: 11 — ABNORMAL LOW (ref 12–28)
BUN: 15 mg/dL (ref 8–27)
CO2: 23 mmol/L (ref 20–29)
Calcium: 9.3 mg/dL (ref 8.7–10.3)
Chloride: 100 mmol/L (ref 96–106)
Creatinine, Ser: 1.31 mg/dL — ABNORMAL HIGH (ref 0.57–1.00)
GFR calc Af Amer: 48 mL/min/{1.73_m2} — ABNORMAL LOW (ref >59)
GFR calc non Af Amer: 42 mL/min/{1.73_m2} — ABNORMAL LOW (ref >59)
Glucose: 269 mg/dL — ABNORMAL HIGH (ref 65–99)
Potassium: 5.1 mmol/L (ref 3.5–5.2)
Sodium: 137 mmol/L (ref 134–144)

## 2019-06-17 ENCOUNTER — Telehealth: Payer: Self-pay | Admitting: Cardiology

## 2019-06-17 NOTE — Telephone Encounter (Signed)
Returned call to patient no answer.LMTC. 

## 2019-06-17 NOTE — Telephone Encounter (Signed)
New message  Patient is returning call about results. Please give patient a call back.

## 2019-06-17 NOTE — Telephone Encounter (Signed)
Spoke to patient recent lab and instructions given.

## 2019-06-17 NOTE — Telephone Encounter (Signed)
Follow up ° ° °Patient is returning your call. Please call. ° ° ° °

## 2019-06-25 ENCOUNTER — Telehealth (HOSPITAL_COMMUNITY): Payer: Self-pay | Admitting: Emergency Medicine

## 2019-06-25 ENCOUNTER — Telehealth: Payer: Self-pay | Admitting: Cardiology

## 2019-06-25 NOTE — Telephone Encounter (Signed)
Attempted to call patient regarding upcoming cardiac CT appointment. Left message on voicemail with name and callback number [name] RN Navigator Cardiac Imaging [facility] [phone] Office [phone] Cell   Pts appt for CCTA postponed d/t abn labs showing renal insufficiency.  Scheduling informed me she had concerns about medications being held for test. Will await call back from patient

## 2019-06-25 NOTE — Telephone Encounter (Signed)
Patient is calling in regards to procedure previously scheduled for tomorrow, 06/26/19. She states the procedure has been cancelled and now she is not sure whether or not she still needs to hold metFORMIN (GLUCOPHAGE) 500 MG tablet medication. She is requesting to speak with Malachy Mood. Please advise.

## 2019-06-25 NOTE — Telephone Encounter (Signed)
Called and spoke with pt, apologized that her test had been canceled for the next day. Notified that she could go ahead and take her medications as normal and then to follow the medication instructions as directed for her Cardiac CT before her test. Reviewed the CT instructions with pt. Pt verbalized understanding with no other questions at this time.

## 2019-06-25 NOTE — Telephone Encounter (Signed)
Reaching out to patient to offer assistance regarding upcoming cardiac imaging study; pt verbalizes understanding of appt date/time, parking situation and where to check in, pre-test NPO status and medications ordered, and verified current allergies; name and call back number provided for further questions should they arise Molly Bond RN Navigator Cardiac Imaging [place] and Vascular [phone] office [phone] cell   Informed patient of appt for IV hydration protocol in medical day made for 12:00 noon. Pt upset about the sudden change in plans and I explained that schedulers are not trained to evaluate lab results, etc for pre-test concerns. That my job as the Art therapist is to ensure we are practicing safe medicine for patients coming in for cardiac imaging studies. I explained that the IV hydration was to protect her kidneys from further damage caused by contrast material. I told her that the appt is no longer 45 mins in length but 3 hrs in length.   She appreciated the thoroughness and concern. Molly Carpenter

## 2019-06-26 ENCOUNTER — Ambulatory Visit (HOSPITAL_COMMUNITY): Admission: RE | Admit: 2019-06-26 | Payer: Medicare Other | Source: Ambulatory Visit

## 2019-06-28 ENCOUNTER — Telehealth (HOSPITAL_COMMUNITY): Payer: Self-pay | Admitting: Emergency Medicine

## 2019-06-28 NOTE — Telephone Encounter (Signed)
Reaching out to patient to offer assistance regarding upcoming cardiac imaging study; pt verbalizes understanding of appt date/time, parking situation and where to check in, pre-test NPO status and medications ordered, and verified current allergies; name and call back number provided for further questions should they arise Trayonna Bachmeier RN Navigator Cardiac Imaging [place] [facility] [phone] office [phone] cell 

## 2019-07-01 ENCOUNTER — Other Ambulatory Visit: Payer: Self-pay

## 2019-07-01 ENCOUNTER — Ambulatory Visit (HOSPITAL_COMMUNITY)
Admission: RE | Admit: 2019-07-01 | Discharge: 2019-07-01 | Disposition: A | Discharge status: Home / Self Care | Payer: Medicare Other | Source: Ambulatory Visit | Attending: Cardiology | Admitting: Cardiology

## 2019-07-01 DIAGNOSIS — R0600 Dyspnea, unspecified: Secondary | ICD-10-CM | POA: Diagnosis present

## 2019-07-01 DIAGNOSIS — I447 Left bundle-branch block, unspecified: Secondary | ICD-10-CM | POA: Diagnosis not present

## 2019-07-01 DIAGNOSIS — I2510 Atherosclerotic heart disease of native coronary artery without angina pectoris: Secondary | ICD-10-CM | POA: Insufficient documentation

## 2019-07-01 DIAGNOSIS — R072 Precordial pain: Secondary | ICD-10-CM

## 2019-07-01 DIAGNOSIS — R0609 Other forms of dyspnea: Secondary | ICD-10-CM

## 2019-07-01 LAB — BASIC METABOLIC PANEL
Anion gap: 10 (ref 5–15)
BUN: 16 mg/dL (ref 8–23)
CO2: 25 mmol/L (ref 22–32)
Calcium: 9.7 mg/dL (ref 8.9–10.3)
Chloride: 107 mmol/L (ref 98–111)
Creatinine, Ser: 1.15 mg/dL — ABNORMAL HIGH (ref 0.44–1.00)
GFR calc Af Amer: 57 mL/min — ABNORMAL LOW (ref >60)
GFR calc non Af Amer: 49 mL/min — ABNORMAL LOW (ref >60)
Glucose, Bld: 99 mg/dL (ref 70–99)
Potassium: 4.6 mmol/L (ref 3.5–5.1)
Sodium: 142 mmol/L (ref 135–145)

## 2019-07-01 MED ORDER — NITROGLYCERIN 0.4 MG SL SUBL
SUBLINGUAL_TABLET | SUBLINGUAL | Status: AC
  Filled 2019-07-01: qty 2

## 2019-07-01 MED ORDER — NITROGLYCERIN 0.4 MG SL SUBL: 0.8000 mg | SUBLINGUAL_TABLET | Freq: Once | SUBLINGUAL | Status: DC

## 2019-07-01 MED ORDER — SODIUM CHLORIDE 0.9 % WEIGHT BASED INFUSION: 1.0000 mL/kg/h | INTRAVENOUS | Status: DC

## 2019-07-01 MED ORDER — SODIUM CHLORIDE 0.9 % WEIGHT BASED INFUSION
3.0000 mL/kg/h | INTRAVENOUS | Status: AC
  Administered 2019-07-01: 3 mL/kg/h via INTRAVENOUS

## 2019-07-01 NOTE — Progress Notes (Signed)
Called from CT and told the patient was having too many PVC's and they could not do her cardiac CT today and they were DC her from CT

## 2019-07-10 ENCOUNTER — Telehealth (HOSPITAL_COMMUNITY): Payer: Self-pay

## 2019-07-10 NOTE — Telephone Encounter (Signed)
Encounter complete. 

## 2019-07-12 ENCOUNTER — Ambulatory Visit (HOSPITAL_COMMUNITY)
Admission: RE | Admit: 2019-07-12 | Discharge: 2019-07-12 | Disposition: A | Discharge status: Home / Self Care | Payer: Medicare Other | Source: Ambulatory Visit | Attending: Internal Medicine | Admitting: Internal Medicine

## 2019-07-12 ENCOUNTER — Other Ambulatory Visit: Payer: Self-pay

## 2019-07-12 DIAGNOSIS — R072 Precordial pain: Secondary | ICD-10-CM | POA: Insufficient documentation

## 2019-07-12 LAB — MYOCARDIAL PERFUSION IMAGING
LV dias vol: 134 mL (ref 46–106)
LV sys vol: 81 mL
Peak HR: 84 {beats}/min
Rest HR: 59 {beats}/min
SDS: 3
SRS: 9
SSS: 12
TID: 1.12

## 2019-07-12 MED ORDER — TECHNETIUM TC 99M TETROFOSMIN IV KIT
9.9000 | PACK | Freq: Once | INTRAVENOUS | Status: AC | PRN
  Administered 2019-07-12: 9.9 via INTRAVENOUS
  Filled 2019-07-12: qty 10

## 2019-07-12 MED ORDER — TECHNETIUM TC 99M TETROFOSMIN IV KIT
32.4000 | PACK | Freq: Once | INTRAVENOUS | Status: AC | PRN
  Administered 2019-07-12: 32.4 via INTRAVENOUS
  Filled 2019-07-12: qty 33

## 2019-07-12 MED ORDER — REGADENOSON 0.4 MG/5ML IV SOLN
0.4000 mg | Freq: Once | INTRAVENOUS | Status: AC
  Administered 2019-07-12: 0.4 mg via INTRAVENOUS

## 2019-07-12 MED ORDER — CARVEDILOL 6.25 MG PO TABS
6.2500 mg | ORAL_TABLET | Freq: Two times a day (BID) | ORAL | 3 refills | Status: DC
Start: 2019-07-12 — End: 2020-01-30

## 2019-08-14 ENCOUNTER — Ambulatory Visit: Payer: Medicare Other | Admitting: Physician Assistant

## 2019-08-14 ENCOUNTER — Encounter: Payer: Self-pay | Admitting: Physician Assistant

## 2019-08-14 ENCOUNTER — Other Ambulatory Visit: Payer: Self-pay

## 2019-08-14 VITALS — BP 120/60 | HR 56 | Temp 94.3°F | Ht 64.0 in | Wt 159.6 lb

## 2019-08-14 DIAGNOSIS — I2510 Atherosclerotic heart disease of native coronary artery without angina pectoris: Secondary | ICD-10-CM

## 2019-08-14 DIAGNOSIS — Z79899 Other long term (current) drug therapy: Secondary | ICD-10-CM

## 2019-08-14 DIAGNOSIS — E785 Hyperlipidemia, unspecified: Secondary | ICD-10-CM | POA: Diagnosis not present

## 2019-08-14 DIAGNOSIS — I10 Essential (primary) hypertension: Secondary | ICD-10-CM | POA: Diagnosis not present

## 2019-08-14 DIAGNOSIS — I447 Left bundle-branch block, unspecified: Secondary | ICD-10-CM

## 2019-08-14 DIAGNOSIS — I255 Ischemic cardiomyopathy: Secondary | ICD-10-CM

## 2019-08-14 DIAGNOSIS — E119 Type 2 diabetes mellitus without complications: Secondary | ICD-10-CM

## 2019-08-14 MED ORDER — TELMISARTAN 40 MG PO TABS: 40.0000 mg | ORAL_TABLET | Freq: Every day | ORAL | 6 refills | Status: DC

## 2019-08-14 NOTE — Patient Instructions (Signed)
Medication Instructions:  INCREASE TELMISARTAN 40MG  DAILY (YOU MAY TAKE 2 UNTIL GONE) *If you need a refill on your cardiac medications before your next appointment, please call your pharmacy*  Lab Work: BMET IN 1-2 WEEK-THIS IS NOT FASTING PLEASE COME IN AT [place] If you have labs (blood work) drawn today and your tests are completely normal, you will receive your results only by:  [place] (if you have MyChart) OR A paper copy in the mail.  If you have any lab test that is abnormal or we need to change your treatment, we will call you to review the results. You may go to any [facility] that is convenient for you however, we do have a lab in our office that is able to assist you. You DO NOT need an appointment for our lab. The lab is open 8:00am and closes at 4:00pm. Lunch 12:45 - 1:45pm.  Special Instructions MAKE SURE TO CALL IF YOUR BLOOD PRESSURE IS <100 OR IF YOU FEEL DIZZINESS, BLURRED VISION OR FEELING LIKE YOU ARE GOING TO PASS OUT  Follow-Up: Your next appointment:  3 month(s)  In Person with Peter Martinique, MD YOU CAN DISCUSS THE DOING THE ULTRASOUND AT THAT TIME. At [facility], you and your health needs are our priority.  As part of our continuing mission to provide you with exceptional heart care, we have created designated Provider Care Teams.  These Care Teams include your primary Cardiologist (physician) and Advanced Practice Providers (APPs -  Physician Assistants and Nurse Practitioners) who all work together to provide you with the care you need, when you need it.  We recommend signing up for the patient portal called "MyChart".  Sign up information is provided on this After Visit Summary.  MyChart is used to connect with patients for Virtual Visits (Telemedicine).  Patients are able to view lab/test results, encounter notes, upcoming appointments, etc.  Non-urgent messages can be sent to your provider as well.   To learn more about what you can do with MyChart, go  to [email or web address].

## 2019-08-14 NOTE — Progress Notes (Signed)
Cardiology Office Note:    Date:  08/16/2019   ID:  Molly Carpenter, DOB September 16, 1950, MRN [id number]  PCP:  Mayra Neer, MD  [facility] Cardiologist:  Peter Martinique, MD  [facility] Electrophysiologist:  None   Referring MD: Mayra Neer, MD   Chief Complaint  Patient presents with  . Follow-up    seen for Dr. Martinique    History of Present Illness:    Molly Carpenter is a 69 y.o. female with a hx of CAD, HTN, HLD, DM II and LBBB.  Patient had a cardiac catheterization in 2002 that showed moderate disease in the septal perforator branch.  Myoview in 2012 showed fixed anterior apical and septal defect likely related to left bundle branch block, no ischemia was noted, EF 53%. Patient was recently seen by Dr. Martinique on 05/22/2019 at which time she complained of more shortness of breath on exertion after she started exercising 3 months ago.  Echocardiogram performed on 06/04/2019 showed EF 40 to 45%, grade 2 DD.  Subsequent Myoview obtained on 07/12/2019 showed EF 40%, medium defect present in the apical anterior, apical septal, and apex location consistent with left bundle branch artifact, no evidence of ischemia.  Given LV dysfunction, carvedilol was added to her telmisartan for heart failure medication titration.  Patient presents today for follow-up, she says she did have some GI upset after starting on the carvedilol, however they have resolved.  I discussed the recent work-up with both the patient and her husband.  At this time, our goal is to continue to uptitrate heart failure regimen.  Her heart rate is in the 50s, therefore I did not try to further uptitrate carvedilol.  Her blood pressure was 120/60 today, I wanted to further uptitrate telmisartan a little further by increasing it to 40 mg daily.  She is aware to contact cardiology if she started having dizziness, blurred vision or feeling of passing out associated with low blood pressure.  Otherwise, we plan to obtain a  repeat basic metabolic panel in 1 to 2 weeks to reassess her renal function and electrolyte due to increased dose on the telmisartan.  Otherwise she can follow-up with Dr. Martinique in 3 months.  She appears to be quite compensated I did not see any lower extremity edema nor does she have any heart failure symptoms.   Past Medical History:  Diagnosis Date  . Chest tightness   . Coronary artery disease    LHC 10/2000: dLAD 70-80% after a second septal perforator, EF 50%.  She was treated medically; Nuclear study 09/2010: EF 53%, fixed distal anterior, apical and septal defect most likely related to LBBB, no ischemia.   . Diabetes mellitus   . Hyperlipidemia   . Hypertension   . Hypotension   . LBBB (left bundle branch block)     Past Surgical History:  Procedure Laterality Date  . ABDOMINAL HYSTERECTOMY    . CARDIAC CATHETERIZATION  11/21/2000   EF 55%. sHOWED A 70-80% STENOSIS IN THE SECOND SEPTAL PERFORATOR BRANCH.  Marland Kitchen CARDIOVASCULAR STRESS TEST  03/30/2006   EF 50%  . CESAREAN SECTION    . ROTATOR CUFF REPAIR    . US ECHOCARDIOGRAPHY  09/25/2008   EF 50%    Current Medications: Current Meds  Medication Sig  . aspirin 81 MG tablet Take 81 mg by mouth daily.    . carvedilol (COREG) 6.25 MG tablet Take 1 tablet (6.25 mg total) by mouth 2 (two) times daily.  . cholecalciferol (VITAMIN  D) 1000 UNITS tablet Take 2,000 Units by mouth daily.    Marland Kitchen ezetimibe-simvastatin (VYTORIN) 10-40 MG tablet Take 1/2 tablet every night  . Insulin Degludec (TRESIBA FLEXTOUCH [place]) Inject into the skin.  . metFORMIN (GLUCOPHAGE) 500 MG tablet Take by mouth 2 (two) times daily with a meal.  . metoprolol tartrate (LOPRESSOR) 100 MG tablet Take 100 mg 2 hours before Coronary CT  . telmisartan (MICARDIS) 40 MG tablet Take 1 tablet (40 mg total) by mouth daily.  . [DISCONTINUED] telmisartan (MICARDIS) 20 MG tablet Take 1 tablet (20 mg total) by mouth daily.     Allergies:   Patient has no known allergies.    Social History   Socioeconomic History  . Marital status: Married    Spouse name: Not on file  . Number of children: 2  . Years of education: Not on file  . Highest education level: Not on file  Occupational History  . Occupation: IT trainer    Comment: [employer]  Tobacco Use  . Smoking status: Never Smoker  . Smokeless tobacco: Never Used  Vaping Use  . Vaping Use: Never used  Substance and Sexual Activity  . Alcohol use: No    Alcohol/week: 0.0 standard drinks  . Drug use: No  . Sexual activity: Not on file  Other Topics Concern  . Not on file  Social History Narrative  . Not on file   Social Determinants of Health   Financial Resource Strain:   . Difficulty of Paying Living Expenses:   Food Insecurity:   . Worried About Charity fundraiser in the Last Year:   . Arboriculturist in the Last Year:   Transportation Needs:   . Film/video editor (Medical):   Marland Kitchen Lack of Transportation (Non-Medical):   Physical Activity:   . Days of Exercise per Week:   . Minutes of Exercise per Session:   Stress:   . Feeling of Stress :   Social Connections:   . Frequency of Communication with Friends and Family:   . Frequency of Social Gatherings with Friends and Family:   . Attends Religious Services:   . Active Member of Clubs or Organizations:   . Attends Archivist Meetings:   Marland Kitchen Marital Status:      Family History: The patient's family history includes Diabetes in her brother, brother, and sister; Heart attack in her brother and mother; Hypertension in her sister; Stroke in her father. There is no history of Breast cancer.  ROS:   Please see the history of present illness.     All other systems reviewed and are negative.  EKGs/Labs/Other Studies Reviewed:    The following studies were reviewed today:  Echo 06/04/2019 1. Left ventricular ejection fraction, by estimation, is 40 to 45%. The  left ventricle has mildly decreased function. The left ventricle   demonstrates global hypokinesis. There is mild concentric left ventricular  hypertrophy. Left ventricular diastolic  parameters are consistent with Grade II diastolic dysfunction  (pseudonormalization). Elevated left atrial pressure.  2. Right ventricular systolic function is normal. The right ventricular  size is normal. Tricuspid regurgitation signal is inadequate for assessing  PA pressure.  3. The mitral valve is grossly normal. No evidence of mitral valve  regurgitation. No evidence of mitral stenosis.  4. The aortic valve is grossly normal. Aortic valve regurgitation is not  visualized. No aortic stenosis is present.  5. The inferior vena cava is normal in size with greater than 50%  respiratory variability, suggesting right atrial pressure of 3 mmHg.   Myoview 07/12/2019  The left ventricular ejection fraction is moderately decreased (30-44%).  Nuclear stress EF: 40%.  There was no ST segment deviation noted during stress.  Defect 1: There is a medium defect present in the apical anterior, apical septal and apex location consistent with LBBB artifact and unchanged from prior.  This is an intermediate risk study due to reduced systolic function.  There is no evidence of ischemia.   EKG:  EKG is not ordered today.    Recent Labs: 07/01/2019: BUN 16; Creatinine, Ser 1.15; Potassium 4.6; Sodium 142  Recent Lipid Panel No results found for: CHOL, TRIG, HDL, CHOLHDL, VLDL, LDLCALC, LDLDIRECT  Physical Exam:    VS:  BP 120/60   Pulse (!) 56   Temp (!) 94.3 F (34.6 C)   Ht 5\' 4"  (1.626 m)   Wt 159 lb 9.6 oz (72.4 kg)   SpO2 98%   BMI 27.40 kg/m     Wt Readings from Last 3 Encounters:  08/14/19 159 lb 9.6 oz (72.4 kg)  07/12/19 158 lb (71.7 kg)  07/01/19 158 lb (71.7 kg)     GEN:  Well nourished, well developed in no acute distress HEENT: Normal NECK: No JVD; No carotid bruits LYMPHATICS: No lymphadenopathy CARDIAC: RRR, no murmurs, rubs,  gallops RESPIRATORY:  Clear to auscultation without rales, wheezing or rhonchi  ABDOMEN: Soft, non-tender, non-distended MUSCULOSKELETAL:  No edema; No deformity  SKIN: Warm and dry NEUROLOGIC:  Alert and oriented x 3 PSYCHIATRIC:  Normal affect   ASSESSMENT:    1. Coronary artery disease involving native coronary artery of native heart without angina pectoris   2. Medication management   3. Essential hypertension   4. Hyperlipidemia LDL goal <70   5. Controlled type 2 diabetes mellitus without complication, without long-term current use of insulin ([place])   6. LBBB (left bundle branch block)   7. Ischemic cardiomyopathy    PLAN:    In order of problems listed above:  1. CAD: Denies any recent chest pain.  Continue aspirin and statin  2. Hypertension: Uptitrate telmisartan to 40 mg daily  3. Hyperlipidemia: Continue Vytorin  4. DM2: Managed by primary care provider  5. Left bundle branch block: Chronic  6. Ischemic cardiomyopathy: Recent echocardiogram shows EF 40 to 45%.  Myoview showed fixed defect without ischemia.  Recommend medical therapy.  Continue carvedilol, further uptitrate telmisartan to 40 mg daily.  Will defer to Dr. Martinique to decide when to repeat echocardiogram.   Medication Adjustments/Labs and Tests Ordered: Current medicines are reviewed at length with the patient today.  Concerns regarding medicines are outlined above.  Orders Placed This Encounter  Procedures  . Basic metabolic panel   Meds ordered this encounter  Medications  . telmisartan (MICARDIS) 40 MG tablet    Sig: Take 1 tablet (40 mg total) by mouth daily.    Dispense:  30 tablet    Refill:  6    Patient Instructions  Medication Instructions:  INCREASE TELMISARTAN 40MG  DAILY (YOU MAY TAKE 2 UNTIL GONE) *If you need a refill on your cardiac medications before your next appointment, please call your pharmacy*  Lab Work: BMET IN 1-2 WEEK-THIS IS NOT FASTING PLEASE COME IN AT [place] If you have labs (blood work) drawn today and your tests are completely normal, you will receive your results only by:  [place] (if you have MyChart) OR A paper copy in the mail.  If you have any lab test that is abnormal or we need to change your treatment, we will call you to review the results. You may go to any [facility] that is convenient for you however, we do have a lab in our office that is able to assist you. You DO NOT need an appointment for our lab. The lab is open 8:00am and closes at 4:00pm. Lunch 12:45 - 1:45pm.  Special Instructions MAKE SURE TO CALL IF YOUR BLOOD PRESSURE IS <100 OR IF YOU FEEL DIZZINESS, BLURRED VISION OR FEELING LIKE YOU ARE GOING TO PASS OUT  Follow-Up: Your next appointment:  3 month(s)  In Person with Peter Martinique, MD YOU CAN DISCUSS THE DOING THE ULTRASOUND AT THAT TIME. At [facility], you and your health needs are our priority.  As part of our continuing mission to provide you with exceptional heart care, we have created designated Provider Care Teams.  These Care Teams include your primary Cardiologist (physician) and Advanced Practice Providers (APPs -  Physician Assistants and Nurse Practitioners) who all work together to provide you with the care you need, when you need it.  We recommend signing up for the patient portal called "MyChart".  Sign up information is provided on this After Visit Summary.  MyChart is used to connect with patients for Virtual Visits (Telemedicine).  Patients are able to view lab/test results, encounter notes, upcoming appointments, etc.  Non-urgent messages can be sent to your provider as well.   To learn more about what you can do with MyChart, go to [email or web address].       Hilbert Corrigan, [place]  08/16/2019 8:53 PM     [facility]

## 2019-08-16 ENCOUNTER — Encounter: Payer: Self-pay | Admitting: Physician Assistant

## 2019-08-30 LAB — BASIC METABOLIC PANEL
BUN/Creatinine Ratio: 16 (ref 12–28)
BUN: 18 mg/dL (ref 8–27)
CO2: 22 mmol/L (ref 20–29)
Calcium: 9.5 mg/dL (ref 8.7–10.3)
Chloride: 104 mmol/L (ref 96–106)
Creatinine, Ser: 1.11 mg/dL — ABNORMAL HIGH (ref 0.57–1.00)
GFR calc Af Amer: 59 mL/min/{1.73_m2} — ABNORMAL LOW (ref >59)
GFR calc non Af Amer: 51 mL/min/{1.73_m2} — ABNORMAL LOW (ref >59)
Glucose: 94 mg/dL (ref 65–99)
Potassium: 4.4 mmol/L (ref 3.5–5.2)
Sodium: 141 mmol/L (ref 134–144)

## 2019-09-04 ENCOUNTER — Telehealth: Payer: Self-pay | Admitting: Cardiology

## 2019-09-04 NOTE — Telephone Encounter (Signed)
Patient is calling about her lab results from labs done on 7/2. Please call.

## 2019-09-04 NOTE — Telephone Encounter (Signed)
The patient has been notified of the result and verbalized understanding.  All questions (if any) were answered. Wilma Flavin, RN 09/04/2019 11:51 AM     Almyra Deforest, PA  09/03/2019 8:59 AM EDT Back to Top    Stable renal function and electrolyte

## 2019-09-23 ENCOUNTER — Other Ambulatory Visit: Payer: Self-pay

## 2019-09-23 ENCOUNTER — Ambulatory Visit: Payer: Medicare Other | Admitting: Podiatry

## 2019-09-23 ENCOUNTER — Encounter: Payer: Self-pay | Admitting: Podiatry

## 2019-09-23 VITALS — Temp 97.6°F

## 2019-09-23 DIAGNOSIS — L600 Ingrowing nail: Secondary | ICD-10-CM

## 2019-09-23 NOTE — Progress Notes (Signed)
Subjective:   Patient ID: Molly Carpenter, female   DOB: 69 y.o.   MRN: [id number]   HPI Patient presents stating I have had discoloration of my left big toenail and I had my right nail removed years ago and I must of traumatized his nail bed.  Patient states it is been going on for a while and patient does not smoke likes to be active   Review of Systems  All other systems reviewed and are negative.       Objective:  Physical Exam Vitals and nursing note reviewed.  Constitutional:      Appearance: She is well-developed.  Pulmonary:     Effort: Pulmonary effort is normal.  Musculoskeletal:        General: Normal range of motion.  Skin:    General: Skin is warm.  Neurological:     Mental Status: She is alert.     Neurovascular status intact muscle strength found to be adequate range of motion is within normal limits.  Patient is noted to have a discolored left hallux nail moderately loose with history of trauma and the right hallux nail has been adequately removed     Assessment:  Damage to the left hallux nail secondary to probable trauma with possible fungal element     Plan:  H&P discussed case in great detail and reviewed what could be considered for this.  Nail removal may be necessary at 1 point in future and I went ahead and I discussed this with the patient and educated her on what would be required.  Patient will be seen back to recheck and may do soaks at the current time and we will watch this with the anticipation of removal

## 2019-11-18 ENCOUNTER — Ambulatory Visit: Payer: Medicare Other | Admitting: Cardiology

## 2019-11-18 NOTE — Progress Notes (Signed)
Cardiology Office Note:    Date:  11/22/2019   ID:  Molly Carpenter, DOB 04-16-1950, MRN [id number]  PCP:  Mayra Neer, MD  [facility] Cardiologist:  Rayyan Orsborn [place], MD  [place] Electrophysiologist:  None   Referring MD: Mayra Neer, MD   Chief Complaint  Patient presents with  . Congestive Heart Failure    History of Present Illness:    Molly Carpenter is a 69 y.o. female with a hx of CAD, HTN, HLD, DM II and LBBB.  Patient had a cardiac catheterization in 2002 that showed moderate disease in the septal perforator branch.  Myoview in 2012 showed fixed anterior apical and septal defect likely related to left bundle branch block, no ischemia was noted, EF 53%. Patient was on 05/22/2019 at which time she complained of more shortness of breath on exertion after she started exercising.  Echocardiogram performed on 06/04/2019 showed EF 40 to 45%, grade 2 DD.  Subsequent Myoview obtained on 07/12/2019 showed EF 40%, medium defect present in the apical anterior, apical septal, and apex location consistent with left bundle branch artifact, no evidence of ischemia.  Given LV dysfunction, carvedilol was added to her telmisartan for heart failure medication titration.  On follow up she is doing well. Denies any chest pain, dyspnea, palpitations. She is in a diabetic study with [facility] and sugars have improved with A1c down to 7.2%. no dizzness. Complain of some right arm numbness/going to sleep and some mid back pain.      Past Medical History:  Diagnosis Date  . Chest tightness   . Coronary artery disease    LHC 10/2000: dLAD 70-80% after a second septal perforator, EF 50%.  She was treated medically; Nuclear study 09/2010: EF 53%, fixed distal anterior, apical and septal defect most likely related to LBBB, no ischemia.   . Diabetes mellitus   . Hyperlipidemia   . Hypertension   . Hypotension   . LBBB (left bundle branch block)     Past Surgical History:   Procedure Laterality Date  . ABDOMINAL HYSTERECTOMY    . CARDIAC CATHETERIZATION  11/21/2000   EF 55%. sHOWED A 70-80% STENOSIS IN THE SECOND SEPTAL PERFORATOR BRANCH.  Marland Kitchen CARDIOVASCULAR STRESS TEST  03/30/2006   EF 50%  . CESAREAN SECTION    . ROTATOR CUFF REPAIR    . US ECHOCARDIOGRAPHY  09/25/2008   EF 50%    Current Medications: Current Meds  Medication Sig  . aspirin 81 MG tablet Take 81 mg by mouth daily.    . carvedilol (COREG) 6.25 MG tablet Take 1 tablet (6.25 mg total) by mouth 2 (two) times daily.  . cholecalciferol (VITAMIN D) 1000 UNITS tablet Take 2,000 Units by mouth daily.    Marland Kitchen ezetimibe-simvastatin (VYTORIN) 10-40 MG tablet Take 1/2 tablet every night  . Insulin Degludec (TRESIBA FLEXTOUCH [place]) Inject into the skin.  . metFORMIN (GLUCOPHAGE) 500 MG tablet Take by mouth 2 (two) times daily with a meal.  . telmisartan (MICARDIS) 40 MG tablet Take 1 tablet (40 mg total) by mouth daily.  . [DISCONTINUED] metoprolol tartrate (LOPRESSOR) 100 MG tablet Take 100 mg 2 hours before Coronary CT     Allergies:   Patient has no known allergies.   Social History   Socioeconomic History  . Marital status: Married    Spouse name: Not on file  . Number of children: 2  . Years of education: Not on file  . Highest education level: Not on file  Occupational History  . Occupation: IT trainer    Comment: [employer]  Tobacco Use  . Smoking status: Never Smoker  . Smokeless tobacco: Never Used  Vaping Use  . Vaping Use: Never used  Substance and Sexual Activity  . Alcohol use: No    Alcohol/week: 0.0 standard drinks  . Drug use: No  . Sexual activity: Not on file  Other Topics Concern  . Not on file  Social History Narrative  . Not on file   Social Determinants of Health   Financial Resource Strain:   . Difficulty of Paying Living Expenses: Not on file  Food Insecurity:   . Worried About Charity fundraiser in the Last Year: Not on file  . Ran Out of Food in the Last Year:  Not on file  Transportation Needs:   . Lack of Transportation (Medical): Not on file  . Lack of Transportation (Non-Medical): Not on file  Physical Activity:   . Days of Exercise per Week: Not on file  . Minutes of Exercise per Session: Not on file  Stress:   . Feeling of Stress : Not on file  Social Connections:   . Frequency of Communication with Friends and Family: Not on file  . Frequency of Social Gatherings with Friends and Family: Not on file  . Attends Religious Services: Not on file  . Active Member of Clubs or Organizations: Not on file  . Attends Archivist Meetings: Not on file  . Marital Status: Not on file     Family History: The patient's family history includes Diabetes in her brother, brother, and sister; Heart attack in her brother and mother; Hypertension in her sister; Stroke in her father. There is no history of Breast cancer.  ROS:   Please see the history of present illness.     All other systems reviewed and are negative.  EKGs/Labs/Other Studies Reviewed:    The following studies were reviewed today:  Echo 06/04/2019 1. Left ventricular ejection fraction, by estimation, is 40 to 45%. The  left ventricle has mildly decreased function. The left ventricle  demonstrates global hypokinesis. There is mild concentric left ventricular  hypertrophy. Left ventricular diastolic  parameters are consistent with Grade II diastolic dysfunction  (pseudonormalization). Elevated left atrial pressure.  2. Right ventricular systolic function is normal. The right ventricular  size is normal. Tricuspid regurgitation signal is inadequate for assessing  PA pressure.  3. The mitral valve is grossly normal. No evidence of mitral valve  regurgitation. No evidence of mitral stenosis.  4. The aortic valve is grossly normal. Aortic valve regurgitation is not  visualized. No aortic stenosis is present.  5. The inferior vena cava is normal in size with greater than  50%  respiratory variability, suggesting right atrial pressure of 3 mmHg.   Myoview 07/12/2019  The left ventricular ejection fraction is moderately decreased (30-44%).  Nuclear stress EF: 40%.  There was no ST segment deviation noted during stress.  Defect 1: There is a medium defect present in the apical anterior, apical septal and apex location consistent with LBBB artifact and unchanged from prior.  This is an intermediate risk study due to reduced systolic function.  There is no evidence of ischemia.   EKG:  EKG is not ordered today.    Recent Labs: 08/30/2019: BUN 18; Creatinine, Ser 1.11; Potassium 4.4; Sodium 141  Recent Lipid Panel No results found for: CHOL, TRIG, HDL, CHOLHDL, VLDL, LDLCALC, LDLDIRECT   Dated 05/08/19: cholesterol 150, triglycerides  202, HDL 42, LDL 48.,  Dated 08/12/19: A1c 8%. Creatinine 1.11. otherwise CMET normal.   Physical Exam:    VS:  BP (!) 126/58   Pulse 62   Ht 5\' 4"  (1.626 m)   Wt 162 lb 6.4 oz (73.7 kg)   SpO2 98%   BMI 27.88 kg/m     Wt Readings from Last 3 Encounters:  11/22/19 162 lb 6.4 oz (73.7 kg)  08/14/19 159 lb 9.6 oz (72.4 kg)  07/12/19 158 lb (71.7 kg)     GEN:  Well nourished, well developed in no acute distress HEENT: Normal NECK: No JVD; No carotid bruits LYMPHATICS: No lymphadenopathy CARDIAC: RRR, no murmurs, rubs, gallops RESPIRATORY:  Clear to auscultation without rales, wheezing or rhonchi  ABDOMEN: Soft, non-tender, non-distended MUSCULOSKELETAL:  No edema; No deformity  SKIN: Warm and dry NEUROLOGIC:  Alert and oriented x 3 PSYCHIATRIC:  Normal affect   ASSESSMENT:    1. Chronic systolic CHF (congestive heart failure) ([place])   2. Coronary artery disease involving native coronary artery of native heart without angina pectoris   3. LBBB (left bundle branch block)   4. Hyperlipidemia LDL goal <70    PLAN:    In order of problems listed above:  1.   Chronic systolic CHF EF 51-76%. Asymptomatic. On  therapy with ARB and Coreg. Euvolemic. I suspect this is mostly related to dyssynchrony with LBBB. Continue current therapy  2. CAD: Denies any recent chest pain.  Continue aspirin and statin  3. Hypertension: Uptitrate telmisartan to 40 mg daily  4. Hyperlipidemia: Continue Vytorin. LDL at goal  5. DM2: Managed by primary care provider  6. Left bundle branch block: Chronic    Medication Adjustments/Labs and Tests Ordered: Current medicines are reviewed at length with the patient today.  Concerns regarding medicines are outlined above.  No orders of the defined types were placed in this encounter.  No orders of the defined types were placed in this encounter.   There are no Patient Instructions on file for this visit.   Signed, Jamar Casagrande [place], MD  11/22/2019 10:45 AM    [facility]

## 2019-11-22 ENCOUNTER — Ambulatory Visit (INDEPENDENT_AMBULATORY_CARE_PROVIDER_SITE_OTHER): Payer: Medicare Other | Admitting: Cardiology

## 2019-11-22 ENCOUNTER — Encounter: Payer: Self-pay | Admitting: Cardiology

## 2019-11-22 ENCOUNTER — Other Ambulatory Visit: Payer: Self-pay

## 2019-11-22 VITALS — BP 126/58 | HR 62 | Ht 64.0 in | Wt 162.4 lb

## 2019-11-22 DIAGNOSIS — I2510 Atherosclerotic heart disease of native coronary artery without angina pectoris: Secondary | ICD-10-CM

## 2019-11-22 DIAGNOSIS — E785 Hyperlipidemia, unspecified: Secondary | ICD-10-CM

## 2019-11-22 DIAGNOSIS — I5022 Chronic systolic (congestive) heart failure: Secondary | ICD-10-CM | POA: Diagnosis not present

## 2019-11-22 DIAGNOSIS — I447 Left bundle-branch block, unspecified: Secondary | ICD-10-CM

## 2020-01-30 ENCOUNTER — Other Ambulatory Visit: Payer: Self-pay | Admitting: Cardiology

## 2020-01-30 MED ORDER — CARVEDILOL 6.25 MG PO TABS: 6.2500 mg | ORAL_TABLET | Freq: Two times a day (BID) | ORAL | 3 refills | Status: DC

## 2020-01-30 NOTE — Telephone Encounter (Signed)
°*  STAT* If patient is at the pharmacy, call can be transferred to refill team.   1. Which medications need to be refilled? (please list name of each medication and dose if known) carvedilol (COREG) 6.25 MG tablet [[id number]   2. Which pharmacy/location (including street and city if local pharmacy) is medication to be sent to [facility]- pt has changed pharmacy and need new script sent to mail order  [phone]  3. Do they need a 30 day or 90 day supply? [place]

## 2020-03-11 DIAGNOSIS — H2512 Age-related nuclear cataract, left eye: Secondary | ICD-10-CM | POA: Diagnosis not present

## 2020-03-12 DIAGNOSIS — H2511 Age-related nuclear cataract, right eye: Secondary | ICD-10-CM | POA: Diagnosis not present

## 2020-03-25 DIAGNOSIS — H5211 Myopia, right eye: Secondary | ICD-10-CM | POA: Diagnosis not present

## 2020-03-25 DIAGNOSIS — H2511 Age-related nuclear cataract, right eye: Secondary | ICD-10-CM | POA: Diagnosis not present

## 2020-03-31 DIAGNOSIS — E1169 Type 2 diabetes mellitus with other specified complication: Secondary | ICD-10-CM | POA: Diagnosis not present

## 2020-05-01 NOTE — Progress Notes (Unsigned)
Cardiology Office Note:    Date:  05/04/2020   ID:  Molly Carpenter, DOB 1950/06/22, MRN [id number]  PCP:  Mayra Neer, MD  [facility] Cardiologist:  Peter [place], MD  [place] Electrophysiologist:  None   Referring MD: Mayra Neer, MD   Chief Complaint  Patient presents with  . Shortness of Breath    History of Present Illness:    Molly Carpenter is a 70 y.o. female with a hx of CAD, HTN, HLD, DM II and LBBB.  Patient had a cardiac catheterization in 2002 that showed moderate disease in the septal perforator branch.  Myoview in 2012 showed fixed anterior apical and septal defect likely related to left bundle branch block, no ischemia was noted, EF 53%. Patient was on 05/22/2019 at which time she complained of more shortness of breath on exertion after she started exercising.  Echocardiogram performed on 06/04/2019 showed EF 40 to 45%, grade 2 DD.  Subsequent Myoview obtained on 07/12/2019 showed EF 40%, medium defect present in the apical anterior, apical septal, and apex location consistent with left bundle branch artifact, no evidence of ischemia.  Given LV dysfunction, carvedilol was added to her telmisartan for heart failure medication titration.  On follow up she is doing well. She is exercising for an hour a day. Normally does well but if she pushes her activity such as playing with kids or picking up limbs she will get SOB and has to stop. Minimal swelling in hands and feet. Weight is stable. She completed a drug trial for her DM with [facility] but doesn't know what she was taking. Did not tolerate semiglutide orally.       Past Medical History:  Diagnosis Date  . Chest tightness   . Coronary artery disease    LHC 10/2000: dLAD 70-80% after a second septal perforator, EF 50%.  She was treated medically; Nuclear study 09/2010: EF 53%, fixed distal anterior, apical and septal defect most likely related to LBBB, no ischemia.   . Diabetes mellitus   .  Hyperlipidemia   . Hypertension   . Hypotension   . LBBB (left bundle branch block)     Past Surgical History:  Procedure Laterality Date  . ABDOMINAL HYSTERECTOMY    . CARDIAC CATHETERIZATION  11/21/2000   EF 55%. sHOWED A 70-80% STENOSIS IN THE SECOND SEPTAL PERFORATOR BRANCH.  Marland Kitchen CARDIOVASCULAR STRESS TEST  03/30/2006   EF 50%  . CESAREAN SECTION    . ROTATOR CUFF REPAIR    . US ECHOCARDIOGRAPHY  09/25/2008   EF 50%    Current Medications: Current Meds  Medication Sig  . aspirin 81 MG tablet Take 81 mg by mouth daily.  . Blood Glucose Monitoring Suppl (ACCU-CHEK GUIDE) w/Device KIT use to check blood sugar  . carvedilol (COREG) 6.25 MG tablet Take 1 tablet (6.25 mg total) by mouth 2 (two) times daily.  . cholecalciferol (VITAMIN D) 1000 UNITS tablet Take 2,000 Units by mouth daily.  . empagliflozin (JARDIANCE) 10 MG TABS tablet Take 1 tablet (10 mg total) by mouth daily before breakfast.  . ezetimibe-simvastatin (VYTORIN) 10-40 MG tablet Take 1/2 tablet every night  . Insulin Degludec (TRESIBA FLEXTOUCH [place]) Inject into the skin.  . metFORMIN (GLUCOPHAGE) 500 MG tablet Take by mouth 2 (two) times daily with a meal.  . Multiple Vitamin (MULTI VITAMIN) TABS 1 tablet  . telmisartan (MICARDIS) 40 MG tablet Take 1 tablet (40 mg total) by mouth daily.     Allergies:  Patient has no known allergies.   Social History   Socioeconomic History  . Marital status: Married    Spouse name: Not on file  . Number of children: 2  . Years of education: Not on file  . Highest education level: Not on file  Occupational History  . Occupation: IT trainer    Comment: [employer]  Tobacco Use  . Smoking status: Never Smoker  . Smokeless tobacco: Never Used  Vaping Use  . Vaping Use: Never used  Substance and Sexual Activity  . Alcohol use: No    Alcohol/week: 0.0 standard drinks  . Drug use: No  . Sexual activity: Not on file  Other Topics Concern  . Not on file  Social History Narrative   . Not on file   Social Determinants of Health   Financial Resource Strain: Not on file  Food Insecurity: Not on file  Transportation Needs: Not on file  Physical Activity: Not on file  Stress: Not on file  Social Connections: Not on file     Family History: The patient's family history includes Diabetes in her brother, brother, and sister; Heart attack in her brother and mother; Hypertension in her sister; Stroke in her father. There is no history of Breast cancer.  ROS:   Please see the history of present illness.     All other systems reviewed and are negative.  EKGs/Labs/Other Studies Reviewed:    The following studies were reviewed today:  Echo 06/04/2019 1. Left ventricular ejection fraction, by estimation, is 40 to 45%. The  left ventricle has mildly decreased function. The left ventricle  demonstrates global hypokinesis. There is mild concentric left ventricular  hypertrophy. Left ventricular diastolic  parameters are consistent with Grade II diastolic dysfunction  (pseudonormalization). Elevated left atrial pressure.  2. Right ventricular systolic function is normal. The right ventricular  size is normal. Tricuspid regurgitation signal is inadequate for assessing  PA pressure.  3. The mitral valve is grossly normal. No evidence of mitral valve  regurgitation. No evidence of mitral stenosis.  4. The aortic valve is grossly normal. Aortic valve regurgitation is not  visualized. No aortic stenosis is present.  5. The inferior vena cava is normal in size with greater than 50%  respiratory variability, suggesting right atrial pressure of 3 mmHg.   Myoview 07/12/2019  The left ventricular ejection fraction is moderately decreased (30-44%).  Nuclear stress EF: 40%.  There was no ST segment deviation noted during stress.  Defect 1: There is a medium defect present in the apical anterior, apical septal and apex location consistent with LBBB artifact and unchanged  from prior.  This is an intermediate risk study due to reduced systolic function.  There is no evidence of ischemia.   EKG:  EKG is not ordered today.  NSR with PVCs. Nonspecific IVCD LBBB type. I have personally reviewed and interpreted this study.   Recent Labs: 08/30/2019: BUN 18; Creatinine, Ser 1.11; Potassium 4.4; Sodium 141  Recent Lipid Panel No results found for: CHOL, TRIG, HDL, CHOLHDL, VLDL, LDLCALC, LDLDIRECT   Dated 05/08/19: cholesterol 150, triglycerides 202, HDL 42, LDL 48.,  Dated 08/12/19: A1c 8%. Creatinine 1.11. otherwise CMET normal.  Dated 12/30/19: cholesterol 115, triglycerides 155, HDL 42, LDL 46. Creatinine 1.16. otherwise CMET normal Dated 03/31/20: A1c 7.1%.   Physical Exam:    VS:  BP 124/68   Pulse 67   Ht 5' 4"  (1.626 m)   Wt 159 lb (72.1 kg)   SpO2 99%  BMI 27.29 kg/m     Wt Readings from Last 3 Encounters:  05/04/20 159 lb (72.1 kg)  11/22/19 162 lb 6.4 oz (73.7 kg)  08/14/19 159 lb 9.6 oz (72.4 kg)     GEN:  Well nourished, well developed in no acute distress HEENT: Normal NECK: No JVD; No carotid bruits LYMPHATICS: No lymphadenopathy CARDIAC: RRR, no murmurs, rubs, gallops RESPIRATORY:  Clear to auscultation without rales, wheezing or rhonchi  ABDOMEN: Soft, non-tender, non-distended MUSCULOSKELETAL:  No edema; No deformity  SKIN: Warm and dry NEUROLOGIC:  Alert and oriented x 3 PSYCHIATRIC:  Normal affect   ASSESSMENT:    1. Chronic systolic CHF (congestive heart failure) ([place])   2. Coronary artery disease involving native coronary artery of native heart without angina pectoris   3. LBBB (left bundle branch block)   4. Essential hypertension   5. Controlled type 2 diabetes mellitus without complication, without long-term current use of insulin (HCC)    PLAN:    In order of problems listed above:  1.   Chronic systolic CHF EF 20-80%. Class 1-2 symptoms. On therapy with ARB and Coreg. Euvolemic. I suspect this is mostly related  to dyssynchrony with LBBB. Continue current therapy. I have recommended starting an SGLT 2 inhibitor - will start with Jardiance 10 mg daily. Plan to repeat Echo later this year.  2. CAD: Denies any recent chest pain.  Continue aspirin and statin  3. Hypertension: currently well controlled.   4. Hyperlipidemia: Continue Vytorin. LDL at goal  5. DM2: Managed by primary care provider. On insulin and metformin. Add SGLT 2 inhibitor.   6. Left bundle branch block: Chronic    Medication Adjustments/Labs and Tests Ordered: Current medicines are reviewed at length with the patient today.  Concerns regarding medicines are outlined above.  Orders Placed This Encounter  Procedures  . EKG 12-Lead   Meds ordered this encounter  Medications  . empagliflozin (JARDIANCE) 10 MG TABS tablet    Sig: Take 1 tablet (10 mg total) by mouth daily before breakfast.    Dispense:  90 tablet    Refill:  3    Patient Instructions  We will try adding Jardiance 10 mg daily  Let me know if this is too expensive and we can look at alternatives.     Signed, Peter [place], MD  05/04/2020 8:19 AM    [facility]

## 2020-05-04 ENCOUNTER — Ambulatory Visit (INDEPENDENT_AMBULATORY_CARE_PROVIDER_SITE_OTHER): Payer: Medicare Other | Admitting: Cardiology

## 2020-05-04 ENCOUNTER — Encounter: Payer: Self-pay | Admitting: Cardiology

## 2020-05-04 ENCOUNTER — Other Ambulatory Visit: Payer: Self-pay

## 2020-05-04 VITALS — BP 124/68 | HR 67 | Ht 64.0 in | Wt 159.0 lb

## 2020-05-04 DIAGNOSIS — I10 Essential (primary) hypertension: Secondary | ICD-10-CM

## 2020-05-04 DIAGNOSIS — E119 Type 2 diabetes mellitus without complications: Secondary | ICD-10-CM | POA: Diagnosis not present

## 2020-05-04 DIAGNOSIS — I2510 Atherosclerotic heart disease of native coronary artery without angina pectoris: Secondary | ICD-10-CM | POA: Diagnosis not present

## 2020-05-04 DIAGNOSIS — I5022 Chronic systolic (congestive) heart failure: Secondary | ICD-10-CM | POA: Diagnosis not present

## 2020-05-04 DIAGNOSIS — I447 Left bundle-branch block, unspecified: Secondary | ICD-10-CM | POA: Diagnosis not present

## 2020-05-04 MED ORDER — EMPAGLIFLOZIN 10 MG PO TABS: 10.0000 mg | ORAL_TABLET | Freq: Every day | ORAL | 3 refills | Status: DC

## 2020-05-04 NOTE — Patient Instructions (Signed)
We will try adding Jardiance 10 mg daily  Let me know if this is too expensive and we can look at alternatives.

## 2020-05-18 ENCOUNTER — Ambulatory Visit: Payer: Medicare Other | Admitting: Cardiology

## 2020-07-01 DIAGNOSIS — I119 Hypertensive heart disease without heart failure: Secondary | ICD-10-CM | POA: Diagnosis not present

## 2020-07-01 DIAGNOSIS — N183 Chronic kidney disease, stage 3 unspecified: Secondary | ICD-10-CM | POA: Diagnosis not present

## 2020-07-01 DIAGNOSIS — I2510 Atherosclerotic heart disease of native coronary artery without angina pectoris: Secondary | ICD-10-CM | POA: Diagnosis not present

## 2020-07-01 DIAGNOSIS — E782 Mixed hyperlipidemia: Secondary | ICD-10-CM | POA: Diagnosis not present

## 2020-07-01 DIAGNOSIS — E1169 Type 2 diabetes mellitus with other specified complication: Secondary | ICD-10-CM | POA: Diagnosis not present

## 2020-07-16 ENCOUNTER — Telehealth: Payer: Self-pay | Admitting: Cardiology

## 2020-07-16 NOTE — Telephone Encounter (Signed)
Spoke to patient Dr.Jordan's advice given. 

## 2020-07-16 NOTE — Telephone Encounter (Signed)
I think this is an interesting study. Looking at a medication that blocks IL 6 inflammatory pathway and reduces inflammation. I would support her enrolling in the study.  Jashun Puertas [place] MD, [facility]

## 2020-07-16 NOTE — Telephone Encounter (Signed)
New message:     Patient calling to speak with a nurse concerning a study her PCP would like for her to concerning people with chronic heart diease and Kidney diease.

## 2020-07-16 NOTE — Telephone Encounter (Signed)
Spoke to patient she stated her PCP wanted her to participate in a study with people who have cardiovascular disease and kidney disease.Stated study is a Automotive engineer.Stated she wanted Dr.Jordan's opinion.Message sent to [place] for advice.

## 2020-07-29 ENCOUNTER — Other Ambulatory Visit: Payer: Self-pay | Admitting: Family Medicine

## 2020-07-29 DIAGNOSIS — Z1231 Encounter for screening mammogram for malignant neoplasm of breast: Secondary | ICD-10-CM

## 2020-08-10 ENCOUNTER — Other Ambulatory Visit: Payer: Self-pay | Admitting: Cardiology

## 2020-08-28 ENCOUNTER — Other Ambulatory Visit: Payer: Self-pay

## 2020-08-28 ENCOUNTER — Ambulatory Visit
Admission: RE | Admit: 2020-08-28 | Discharge: 2020-08-28 | Disposition: A | Discharge status: Home / Self Care | Payer: Medicare Other | Source: Ambulatory Visit | Attending: Family Medicine | Admitting: Family Medicine

## 2020-08-28 DIAGNOSIS — Z1231 Encounter for screening mammogram for malignant neoplasm of breast: Secondary | ICD-10-CM

## 2020-09-23 ENCOUNTER — Other Ambulatory Visit: Payer: Self-pay

## 2020-09-23 MED ORDER — EMPAGLIFLOZIN 10 MG PO TABS: 10.0000 mg | ORAL_TABLET | Freq: Every day | ORAL | 3 refills | Status: DC

## 2020-10-28 ENCOUNTER — Telehealth: Payer: Self-pay | Admitting: Cardiology

## 2020-10-28 NOTE — Telephone Encounter (Signed)
Pt c/o BP issue: STAT if pt c/o blurred vision, one-sided weakness or slurred speech  1. What are your last 5 BP readings? Doesn't have   2. Are you having any other symptoms (ex. Dizziness, headache, blurred vision, passed out)? Nausea and light headedness   3. What is your BP issue? Blood pressure is dropping

## 2020-10-28 NOTE — Telephone Encounter (Signed)
Returned the call to the patient. She stated that she has has been having intermittent dizzy spells for the past few weeks and has notice a steady decline in her blood pressure and heart rate.   She was on Carvedilol 6.25 mg bid and Telmisartan 40 mg once daily. She spoke with PCP who suggested that she cut the Telmisartan in half to 20 mg which she started today.  Yesterday her blood pressure was 99/42. She stated that she does not check it daily. Her heart rate has been in the 50's.  She has been advised to check her blood pressure daily, an hour or two after her medications, and keep a log of this so that we can see how it does with the decrease in the Telmisartan.

## 2020-10-29 NOTE — Telephone Encounter (Signed)
Spoke with pt, aware of dr jordan's recommendations. 

## 2020-10-29 NOTE — Telephone Encounter (Signed)
Patient was calling back for undate

## 2020-10-29 NOTE — Telephone Encounter (Signed)
Agree with recs. We will see how she does on reduced medication dose.  Camree Wigington [place] MD, [facility]

## 2020-11-06 DIAGNOSIS — E1169 Type 2 diabetes mellitus with other specified complication: Secondary | ICD-10-CM | POA: Diagnosis not present

## 2020-11-06 DIAGNOSIS — R059 Cough, unspecified: Secondary | ICD-10-CM | POA: Diagnosis not present

## 2020-11-16 DIAGNOSIS — R059 Cough, unspecified: Secondary | ICD-10-CM | POA: Diagnosis not present

## 2020-12-04 NOTE — Progress Notes (Signed)
Cardiology Office Note:    Date:  12/07/2020   ID:  YOUSRA Carpenter, DOB 01-28-1951, MRN [id number]  PCP:  Molly Neer, MD  [facility] Cardiologist:  Molly Lybrand [place], MD  [facility] Electrophysiologist:  None   Referring MD: Molly Neer, MD   Chief Complaint  Patient presents with   Congestive Heart Failure     History of Present Illness:    Molly Carpenter is a 70 y.o. female with a hx of CAD, HTN, HLD, DM II and LBBB.  Patient had a cardiac catheterization in 2002 that showed moderate disease in the septal perforator branch.  Myoview in 2012 showed fixed anterior apical and septal defect likely related to left bundle branch block, no ischemia was noted, EF 53%. Patient was on 05/22/2019 at which time she complained of more shortness of breath on exertion after she started exercising.  Echocardiogram performed on 06/04/2019 showed EF 40 to 45%, grade 2 DD.  Subsequent Myoview obtained on 07/12/2019 showed EF 40%, medium defect present in the apical anterior, apical septal, and apex location consistent with left bundle branch artifact, no evidence of ischemia.  Given LV dysfunction, carvedilol was added to her telmisartan for heart failure medication titration. She later developed hypotension and telmisartan dose was reduced. She was started on Jardiance.   On follow up she is doing well. She is active. Minimal SOB with exertion.  No edema.  Weight is stable. She is enrolled in a study with Molly Carpenter that is looking at a drug that targets the IL-6 inflammasome.      Past Medical History:  Diagnosis Date   Chest tightness    Coronary artery disease    LHC 10/2000: dLAD 70-80% after a second septal perforator, EF 50%.  She was treated medically; Nuclear study 09/2010: EF 53%, fixed distal anterior, apical and septal defect most likely related to LBBB, no ischemia.    Diabetes mellitus    Hyperlipidemia    Hypertension    Hypotension    LBBB (left bundle branch  block)     Past Surgical History:  Procedure Laterality Date   ABDOMINAL HYSTERECTOMY     CARDIAC CATHETERIZATION  11/21/2000   EF 55%. sHOWED A 70-80% STENOSIS IN THE SECOND SEPTAL PERFORATOR BRANCH.   CARDIOVASCULAR STRESS TEST  03/30/2006   EF 50%   CESAREAN SECTION     ROTATOR CUFF REPAIR     US ECHOCARDIOGRAPHY  09/25/2008   EF 50%    Current Medications: No outpatient medications have been marked as taking for the 12/07/20 encounter (Office Visit) with Carpenter, Molly Wente M, MD.     Allergies:   Patient has no known allergies.   Social History   Socioeconomic History   Marital status: Married    Spouse name: Not on file   Number of children: 2   Years of education: Not on file   Highest education level: Not on file  Occupational History   Occupation: IT trainer    Comment: Molly Carpenter  Tobacco Use   Smoking status: Never   Smokeless tobacco: Never  Vaping Use   Vaping Use: Never used  Substance and Sexual Activity   Alcohol use: No    Alcohol/week: 0.0 standard drinks   Drug use: No   Sexual activity: Not on file  Other Topics Concern   Not on file  Social History Narrative   Not on file   Social Determinants of Health   Financial Resource Strain: Not on file  Food Insecurity: Not  on file  Transportation Needs: Not on file  Physical Activity: Not on file  Stress: Not on file  Social Connections: Not on file     Family History: The patient's family history includes Diabetes in her brother, brother, and sister; Heart attack in her brother and mother; Hypertension in her sister; Stroke in her father. There is no history of Breast cancer.  ROS:   Please see the history of present illness.     All other systems reviewed and are negative.  EKGs/Labs/Other Studies Reviewed:    The following studies were reviewed today:  Echo 06/04/2019  1. Left ventricular ejection fraction, by estimation, is 40 to 45%. The  left ventricle has mildly decreased function. The left  ventricle  demonstrates global hypokinesis. There is mild concentric left ventricular  hypertrophy. Left ventricular diastolic  parameters are consistent with Grade II diastolic dysfunction  (pseudonormalization). Elevated left atrial pressure.   2. Right ventricular systolic function is normal. The right ventricular  size is normal. Tricuspid regurgitation signal is inadequate for assessing  PA pressure.   3. The mitral valve is grossly normal. No evidence of mitral valve  regurgitation. No evidence of mitral stenosis.   4. The aortic valve is grossly normal. Aortic valve regurgitation is not  visualized. No aortic stenosis is present.   5. The inferior vena cava is normal in size with greater than 50%  respiratory variability, suggesting right atrial pressure of 3 mmHg.   Myoview 07/12/2019 The left ventricular ejection fraction is moderately decreased (30-44%). Nuclear stress EF: 40%. There was no ST segment deviation noted during stress. Defect 1: There is a medium defect present in the apical anterior, apical septal and apex location consistent with LBBB artifact and unchanged from prior. This is an intermediate risk study due to reduced systolic function. There is no evidence of ischemia.   EKG:  EKG is not ordered today.    Recent Labs: No results found for requested labs within last 8760 hours.  Recent Lipid Panel No results found for: CHOL, TRIG, HDL, CHOLHDL, VLDL, LDLCALC, LDLDIRECT   Dated 05/08/19: cholesterol 150, triglycerides 202, HDL 42, LDL 48.,  Dated 08/12/19: A1c 8%. Creatinine 1.11. otherwise CMET normal.  Dated 12/30/19: cholesterol 115, triglycerides 155, HDL 42, LDL 46. Creatinine 1.16. otherwise CMET normal Dated 03/31/20: A1c 7.1%.  Dated 07/01/20: A1c 7.5%. cholesterol  117, triglycerides 151, HDL 43, LDL 48. Creatinine 1.23. otherwise CMET normal.   Physical Exam:    VS:  BP 130/67   Pulse 62   Ht '5\' 4"'$  (1.626 Carpenter)   Wt 155 lb 9.6 oz (70.6 kg)   SpO2  99%   BMI 26.71 kg/Carpenter     Wt Readings from Last 3 Encounters:  12/07/20 155 lb 9.6 oz (70.6 kg)  05/04/20 159 lb (72.1 kg)  11/22/19 162 lb 6.4 oz (73.7 kg)     GEN:  Well nourished, well developed in no acute distress HEENT: Normal NECK: No JVD; No carotid bruits LYMPHATICS: No lymphadenopathy CARDIAC: RRR, no murmurs, rubs, gallops RESPIRATORY:  Clear to auscultation without rales, wheezing or rhonchi  ABDOMEN: Soft, non-tender, non-distended MUSCULOSKELETAL:  No edema; No deformity  SKIN: Warm and dry NEUROLOGIC:  Alert and oriented x 3 PSYCHIATRIC:  Normal affect   ASSESSMENT:    1. Chronic systolic CHF (congestive heart failure) ([place])   2. Coronary artery disease involving native coronary artery of native heart without angina pectoris   3. LBBB (left bundle branch block)   4.  Hyperlipidemia LDL goal <70   5. Essential hypertension     PLAN:    In order of problems listed above:  1.   Chronic systolic CHF EF [id number]. Class 1-2 symptoms. On therapy with ARB and Coreg. Euvolemic. I suspect this is mostly related to dyssynchrony with LBBB. Currently on telmisartan, Coreg, and Jardiance at maximally tolerated doses. Will update Echo.   2. CAD: Denies any recent chest pain.  Continue aspirin and statin  3. Hypertension: currently well controlled.   4. Hyperlipidemia: Continue Vytorin. LDL at goal  5. DM2: Managed by primary care provider. On Tresiba, metformin, and Jardiance.   6. Left bundle branch block: Chronic    Medication Adjustments/Labs and Tests Ordered: Current medicines are reviewed at length with the patient today.  Concerns regarding medicines are outlined above.  No orders of the defined types were placed in this encounter.  No orders of the defined types were placed in this encounter.   There are no Patient Instructions on file for this visit.   Signed, Shiloh Southern [place], MD  12/07/2020 9:01 AM    [place]

## 2020-12-07 ENCOUNTER — Other Ambulatory Visit: Payer: Self-pay

## 2020-12-07 ENCOUNTER — Encounter: Payer: Self-pay | Admitting: Cardiology

## 2020-12-07 ENCOUNTER — Ambulatory Visit: Payer: Medicare Other | Admitting: Cardiology

## 2020-12-07 VITALS — BP 130/67 | HR 62 | Ht 64.0 in | Wt 155.6 lb

## 2020-12-07 DIAGNOSIS — I447 Left bundle-branch block, unspecified: Secondary | ICD-10-CM

## 2020-12-07 DIAGNOSIS — I10 Essential (primary) hypertension: Secondary | ICD-10-CM | POA: Diagnosis not present

## 2020-12-07 DIAGNOSIS — I5022 Chronic systolic (congestive) heart failure: Secondary | ICD-10-CM

## 2020-12-07 DIAGNOSIS — E785 Hyperlipidemia, unspecified: Secondary | ICD-10-CM | POA: Diagnosis not present

## 2020-12-07 DIAGNOSIS — I2510 Atherosclerotic heart disease of native coronary artery without angina pectoris: Secondary | ICD-10-CM | POA: Diagnosis not present

## 2020-12-07 NOTE — Patient Instructions (Signed)
Medication Instructions:  Continue same medications *If you need a refill on your cardiac medications before your next appointment, please call your pharmacy*   Lab Work: None ordered   Testing/Procedures: Echo   Follow-Up: At [facility], you and your health needs are our priority.  As part of our continuing mission to provide you with exceptional heart care, we have created designated Provider Care Teams.  These Care Teams include your primary Cardiologist (physician) and Advanced Practice Providers (APPs -  Physician Assistants and Nurse Practitioners) who all work together to provide you with the care you need, when you need it.  We recommend signing up for the patient portal called "MyChart".  Sign up information is provided on this After Visit Summary.  MyChart is used to connect with patients for Virtual Visits (Telemedicine).  Patients are able to view lab/test results, encounter notes, upcoming appointments, etc.  Non-urgent messages can be sent to your provider as well.   To learn more about what you can do with MyChart, go to [email or web address].     Your next appointment: 6 months    Call in Jan to schedule April appointment      The format for your next appointment: Office   Provider:  Dr.Jordan

## 2020-12-16 ENCOUNTER — Ambulatory Visit (HOSPITAL_COMMUNITY): Payer: Medicare Other | Attending: Cardiology

## 2020-12-16 ENCOUNTER — Other Ambulatory Visit: Payer: Self-pay

## 2020-12-16 DIAGNOSIS — I10 Essential (primary) hypertension: Secondary | ICD-10-CM | POA: Diagnosis not present

## 2020-12-16 DIAGNOSIS — E785 Hyperlipidemia, unspecified: Secondary | ICD-10-CM

## 2020-12-16 DIAGNOSIS — I2510 Atherosclerotic heart disease of native coronary artery without angina pectoris: Secondary | ICD-10-CM

## 2020-12-16 DIAGNOSIS — I447 Left bundle-branch block, unspecified: Secondary | ICD-10-CM

## 2020-12-16 DIAGNOSIS — I5022 Chronic systolic (congestive) heart failure: Secondary | ICD-10-CM | POA: Diagnosis not present

## 2020-12-16 LAB — ECHOCARDIOGRAM COMPLETE
Area-P 1/2: 2.83 cm2
S' Lateral: 3.1 cm

## 2020-12-18 ENCOUNTER — Other Ambulatory Visit: Payer: Self-pay

## 2020-12-18 DIAGNOSIS — I5022 Chronic systolic (congestive) heart failure: Secondary | ICD-10-CM

## 2020-12-18 DIAGNOSIS — I10 Essential (primary) hypertension: Secondary | ICD-10-CM

## 2020-12-18 MED ORDER — SPIRONOLACTONE 25 MG PO TABS
ORAL_TABLET | ORAL | 3 refills | Status: DC
Start: 2020-12-18 — End: 2021-01-12

## 2020-12-18 NOTE — Progress Notes (Signed)
bmet  

## 2020-12-22 DIAGNOSIS — E113293 Type 2 diabetes mellitus with mild nonproliferative diabetic retinopathy without macular edema, bilateral: Secondary | ICD-10-CM | POA: Diagnosis not present

## 2021-01-01 DIAGNOSIS — I10 Essential (primary) hypertension: Secondary | ICD-10-CM | POA: Diagnosis not present

## 2021-01-01 DIAGNOSIS — I5022 Chronic systolic (congestive) heart failure: Secondary | ICD-10-CM | POA: Diagnosis not present

## 2021-01-01 LAB — BASIC METABOLIC PANEL
BUN/Creatinine Ratio: 19 (ref 12–28)
BUN: 28 mg/dL — ABNORMAL HIGH (ref 8–27)
CO2: 24 mmol/L (ref 20–29)
Calcium: 9.1 mg/dL (ref 8.7–10.3)
Chloride: 100 mmol/L (ref 96–106)
Creatinine, Ser: 1.47 mg/dL — ABNORMAL HIGH (ref 0.57–1.00)
Glucose: 129 mg/dL — ABNORMAL HIGH (ref 70–99)
Potassium: 4.2 mmol/L (ref 3.5–5.2)
Sodium: 137 mmol/L (ref 134–144)
eGFR: 38 mL/min/{1.73_m2} — ABNORMAL LOW (ref >59)

## 2021-01-11 DIAGNOSIS — N1831 Chronic kidney disease, stage 3a: Secondary | ICD-10-CM | POA: Diagnosis not present

## 2021-01-11 DIAGNOSIS — I119 Hypertensive heart disease without heart failure: Secondary | ICD-10-CM | POA: Diagnosis not present

## 2021-01-11 DIAGNOSIS — E782 Mixed hyperlipidemia: Secondary | ICD-10-CM | POA: Diagnosis not present

## 2021-01-11 DIAGNOSIS — I2510 Atherosclerotic heart disease of native coronary artery without angina pectoris: Secondary | ICD-10-CM | POA: Diagnosis not present

## 2021-01-11 DIAGNOSIS — I509 Heart failure, unspecified: Secondary | ICD-10-CM | POA: Diagnosis not present

## 2021-01-11 DIAGNOSIS — R8290 Unspecified abnormal findings in urine: Secondary | ICD-10-CM | POA: Diagnosis not present

## 2021-01-11 DIAGNOSIS — E1169 Type 2 diabetes mellitus with other specified complication: Secondary | ICD-10-CM | POA: Diagnosis not present

## 2021-01-11 DIAGNOSIS — M8000XA Age-related osteoporosis with current pathological fracture, unspecified site, initial encounter for fracture: Secondary | ICD-10-CM | POA: Diagnosis not present

## 2021-01-11 DIAGNOSIS — Z0000 Encounter for general adult medical examination without abnormal findings: Secondary | ICD-10-CM | POA: Diagnosis not present

## 2021-01-12 ENCOUNTER — Other Ambulatory Visit: Payer: Self-pay

## 2021-01-14 ENCOUNTER — Other Ambulatory Visit: Payer: Self-pay | Admitting: Family Medicine

## 2021-01-14 DIAGNOSIS — M810 Age-related osteoporosis without current pathological fracture: Secondary | ICD-10-CM

## 2021-02-10 ENCOUNTER — Other Ambulatory Visit: Payer: Self-pay | Admitting: Cardiology

## 2021-03-04 ENCOUNTER — Telehealth: Payer: Self-pay

## 2021-03-04 NOTE — Telephone Encounter (Signed)
Spoke to patient she stated she did not qualify for Jardiance patient assistance.

## 2021-03-24 ENCOUNTER — Telehealth: Payer: Self-pay | Admitting: Cardiology

## 2021-03-24 NOTE — Telephone Encounter (Signed)
Patient calling the office for samples of medication:   1.  What medication and dosage are you requesting samples for? empagliflozin (JARDIANCE) 10 MG TABS tablet  2.  Are you currently out of this medication? No, has 5 days left

## 2021-03-24 NOTE — Telephone Encounter (Signed)
Attempted to call patient, left message for patient to call back to office. - left message stating that samples are at front desk, and to check with insurance to see what the deductible is to be met before insurance will start paying for medication.  2 Boxes of Jardiance 1m Daily left at front desk for Pick up LOT: [id number] EXP: 03/2023

## 2021-04-27 DIAGNOSIS — H61032 Chondritis of left external ear: Secondary | ICD-10-CM | POA: Diagnosis not present

## 2021-05-10 DIAGNOSIS — N1831 Chronic kidney disease, stage 3a: Secondary | ICD-10-CM | POA: Diagnosis not present

## 2021-05-10 DIAGNOSIS — E782 Mixed hyperlipidemia: Secondary | ICD-10-CM | POA: Diagnosis not present

## 2021-05-10 DIAGNOSIS — I119 Hypertensive heart disease without heart failure: Secondary | ICD-10-CM | POA: Diagnosis not present

## 2021-05-10 DIAGNOSIS — E1169 Type 2 diabetes mellitus with other specified complication: Secondary | ICD-10-CM | POA: Diagnosis not present

## 2021-05-10 DIAGNOSIS — I509 Heart failure, unspecified: Secondary | ICD-10-CM | POA: Diagnosis not present

## 2021-05-28 DIAGNOSIS — E782 Mixed hyperlipidemia: Secondary | ICD-10-CM | POA: Diagnosis not present

## 2021-05-28 DIAGNOSIS — E1169 Type 2 diabetes mellitus with other specified complication: Secondary | ICD-10-CM | POA: Diagnosis not present

## 2021-05-28 DIAGNOSIS — I2510 Atherosclerotic heart disease of native coronary artery without angina pectoris: Secondary | ICD-10-CM | POA: Diagnosis not present

## 2021-06-12 NOTE — Progress Notes (Signed)
?Cardiology Office Note:   ? ?Date:  06/17/2021  ? ?ID:  Molly Carpenter, DOB [birth year], MRN [id number] ? ?PCP:  Molly Neer, MD  ?[place] Cardiologist:  Molly Medici [place], MD  ?[facility] Electrophysiologist:  None  ? ?Referring MD: Molly Neer, MD  ? ?Chief Complaint  ?Patient presents with  ? Congestive Heart Failure  ? Dizziness  ? ? ? ?History of Present Illness:   ? ?Molly Carpenter is a 71 y.o. female with a hx of CAD, HTN, HLD, DM II and LBBB.  Patient had a cardiac catheterization in 2002 that showed moderate disease in the septal perforator branch.  Myoview in 2012 showed fixed anterior apical and septal defect likely related to left bundle branch block, no ischemia was noted, EF 53%. Patient was on 05/22/2019 at which time she complained of more shortness of breath on exertion after she started exercising.  Echocardiogram performed on 06/04/2019 showed EF 40 to 45%, grade 2 DD.  Subsequent Myoview obtained on 07/12/2019 showed EF 40%, medium defect present in the apical anterior, apical septal, and apex location consistent with left bundle branch artifact, no evidence of ischemia.  Given LV dysfunction, carvedilol was added to her telmisartan for heart failure medication titration. She later developed hypotension and telmisartan dose was reduced. She was started on Jardiance.  ? ?She is enrolled in a study with [employer] that is looking at a drug that targets the IL-6 inflammasome.  ? ?On her last visit in October we repeated an Echo that showed no change. EF 35-40%. We added aldactone at that time. Repeat lab in November  showed increase in creatinine so aldactone was stopped. She does complain of SOB if she walks a lot. This resolves quickly with rest. She is active helping care for her grandchildren. She has experienced symptoms of lightheadedness and feeling out of focus. May occur any time but worse in the morning after meds. BP has been OK at home. Denies any palpitations.   ? ? ? ?Past Medical History:  ?Diagnosis Date  ? Chest tightness   ? Coronary artery disease   ? LHC 10/2000: dLAD 70-80% after a second septal perforator, EF 50%.  She was treated medically; Nuclear study 09/2010: EF 53%, fixed distal anterior, apical and septal defect most likely related to LBBB, no ischemia.   ? Diabetes mellitus   ? Hyperlipidemia   ? Hypertension   ? Hypotension   ? LBBB (left bundle branch block)   ? ? ?Past Surgical History:  ?Procedure Laterality Date  ? ABDOMINAL HYSTERECTOMY    ? CARDIAC CATHETERIZATION  11/21/2000  ? EF 55%. sHOWED A 70-80% STENOSIS IN THE SECOND SEPTAL PERFORATOR BRANCH.  ? CARDIOVASCULAR STRESS TEST  03/30/2006  ? EF 50%  ? CESAREAN SECTION    ? ROTATOR CUFF REPAIR    ? US ECHOCARDIOGRAPHY  09/25/2008  ? EF 50%  ? ? ?Current Medications: ?Current Meds  ?Medication Sig  ? aspirin 81 MG tablet Take 81 mg by mouth daily.  ? Blood Glucose Monitoring Suppl (ACCU-CHEK GUIDE) w/Device KIT use to check blood sugar  ? carvedilol (COREG) 6.25 MG tablet TAKE 1 TABLET(6.25 MG) BY MOUTH TWICE DAILY  ? cholecalciferol (VITAMIN D) 1000 UNITS tablet Take 2,000 Units by mouth daily.  ? Insulin Degludec (TRESIBA FLEXTOUCH [place]) Inject into the skin.  ? metFORMIN (GLUCOPHAGE) 500 MG tablet Take by mouth 2 (two) times daily with a meal.  ? Multiple Vitamin (MULTI VITAMIN) TABS 1 tablet  ? telmisartan (  MICARDIS) 40 MG tablet Take 1 tablet (40 mg total) by mouth daily. (Patient taking differently: Take 20 mg by mouth daily.)  ?  ? ?Allergies:   Patient has no known allergies.  ? ?Social History  ? ?Socioeconomic History  ? Marital status: Married  ?  Spouse name: Not on file  ? Number of children: 2  ? Years of education: Not on file  ? Highest education level: Not on file  ?Occupational History  ? Occupation: IT trainer  ?  Comment: [employer]  ?Tobacco Use  ? Smoking status: Never  ? Smokeless tobacco: Never  ?Vaping Use  ? Vaping Use: Never used  ?Substance and Sexual Activity  ? Alcohol use: No  ?   Alcohol/week: 0.0 standard drinks  ? Drug use: No  ? Sexual activity: Not on file  ?Other Topics Concern  ? Not on file  ?Social History Narrative  ? Not on file  ? ?Social Determinants of Health  ? ?Financial Resource Strain: Not on file  ?Food Insecurity: Not on file  ?Transportation Needs: Not on file  ?Physical Activity: Not on file  ?Stress: Not on file  ?Social Connections: Not on file  ?  ? ?Family History: ?The patient's family history includes Diabetes in her brother, brother, and sister; Heart attack in her brother and mother; Hypertension in her sister; Stroke in her father. There is no history of Breast cancer. ? ?ROS:   ?Please see the history of present illness.    ? All other systems reviewed and are negative. ? ?EKGs/Labs/Other Studies Reviewed:   ? ?The following studies were reviewed today: ? ?Echo 06/04/2019 ? 1. Left ventricular ejection fraction, by estimation, is 40 to 45%. The  ?left ventricle has mildly decreased function. The left ventricle  ?demonstrates global hypokinesis. There is mild concentric left ventricular  ?hypertrophy. Left ventricular diastolic  ?parameters are consistent with Grade II diastolic dysfunction  ?(pseudonormalization). Elevated left atrial pressure.  ? 2. Right ventricular systolic function is normal. The right ventricular  ?size is normal. Tricuspid regurgitation signal is inadequate for assessing  ?PA pressure.  ? 3. The mitral valve is grossly normal. No evidence of mitral valve  ?regurgitation. No evidence of mitral stenosis.  ? 4. The aortic valve is grossly normal. Aortic valve regurgitation is not  ?visualized. No aortic stenosis is present.  ? 5. The inferior vena cava is normal in size with greater than 50%  ?respiratory variability, suggesting right atrial pressure of 3 mmHg. ? ? ?Myoview 07/12/2019 ?The left ventricular ejection fraction is moderately decreased (30-44%). ?Nuclear stress EF: 40%. ?There was no ST segment deviation noted during stress. ?Defect  1: There is a medium defect present in the apical anterior, apical septal and apex location consistent with LBBB artifact and unchanged from prior. ?This is an intermediate risk study due to reduced systolic function. ?There is no evidence of ischemia. ? ?Echo 12/16/20: IMPRESSIONS  ? ? ? 1. Left ventricular ejection fraction, by estimation, is 35 to 40%. The  ?left ventricle has moderately decreased function. The left ventricle has  ?no regional wall motion abnormalities. Left ventricular diastolic  ?parameters are consistent with Grade I  ?diastolic dysfunction (impaired relaxation).  ? 2. Right ventricular systolic function is normal. The right ventricular  ?size is normal. There is normal pulmonary artery systolic pressure. The  ?estimated right ventricular systolic pressure is 44.3 mmHg.  ? 3. The mitral valve is normal in structure. Mild mitral valve  ?regurgitation. No evidence of mitral stenosis.  ?  4. The aortic valve is normal in structure. Aortic valve regurgitation is  ?not visualized. No aortic stenosis is present.  ? 5. The inferior vena cava is normal in size with greater than 50%  ?respiratory variability, suggesting right atrial pressure of 3 mmHg.  ? ?Comparison(s): No significant change from prior study. Prior images  ?reviewed side by side.  ? ?EKG:  EKG is ordered today. NSR with frequent PVCs. LAD. LBBB. Rate 73. I have personally reviewed and interpreted this study. ? ? ? ?Recent Labs: ?01/01/2021: BUN 28; Creatinine, Ser 1.47; Potassium 4.2; Sodium 137  ?Recent Lipid Panel ?No results found for: CHOL, TRIG, HDL, CHOLHDL, VLDL, LDLCALC, LDLDIRECT  ? ?Dated 05/08/19: cholesterol 150, triglycerides 202, HDL 42, LDL 48.,  ?Dated 08/12/19: A1c 8%. Creatinine 1.11. otherwise CMET normal.  ?Dated 12/30/19: cholesterol 115, triglycerides 155, HDL 42, LDL 46. Creatinine 1.16. otherwise CMET normal ?Dated 03/31/20: A1c 7.1%.  ?Dated 07/01/20: A1c 7.5%. cholesterol  117, triglycerides 151, HDL 43, LDL 48.  Creatinine 1.23. otherwise CMET normal.  ?Dated 01/11/21: cholesterol 111, triglycerides 174, HDL 41, LDL 42 ?Dated 05/10/21: A1c 8%. Creatinine 1.23. otherwise CMET normal. ? ?Physical Exam:   ? ?VS:  BP 100/6

## 2021-06-17 ENCOUNTER — Encounter: Payer: Self-pay | Admitting: Cardiology

## 2021-06-17 ENCOUNTER — Ambulatory Visit: Payer: Medicare Other | Admitting: Cardiology

## 2021-06-17 ENCOUNTER — Ambulatory Visit (INDEPENDENT_AMBULATORY_CARE_PROVIDER_SITE_OTHER): Payer: Medicare Other

## 2021-06-17 VITALS — BP 100/68 | HR 73 | Ht 64.5 in | Wt 155.4 lb

## 2021-06-17 DIAGNOSIS — I5022 Chronic systolic (congestive) heart failure: Secondary | ICD-10-CM

## 2021-06-17 DIAGNOSIS — I447 Left bundle-branch block, unspecified: Secondary | ICD-10-CM

## 2021-06-17 DIAGNOSIS — I493 Ventricular premature depolarization: Secondary | ICD-10-CM

## 2021-06-17 DIAGNOSIS — E785 Hyperlipidemia, unspecified: Secondary | ICD-10-CM

## 2021-06-17 DIAGNOSIS — I2510 Atherosclerotic heart disease of native coronary artery without angina pectoris: Secondary | ICD-10-CM

## 2021-06-17 NOTE — Patient Instructions (Addendum)
We will have you wear an event monitor for 2 weeks.  ? ?Follow up appointment in 6 months ?

## 2021-06-17 NOTE — Addendum Note (Signed)
Addended by: Kathyrn Lass on: 06/17/2021 08:34 AM ? ? Modules accepted: Orders ? ?

## 2021-06-17 NOTE — Progress Notes (Unsigned)
Enrolled patient for a 14 day Zio XT  monitor to be mailed to patients home  °

## 2021-06-19 DIAGNOSIS — I2510 Atherosclerotic heart disease of native coronary artery without angina pectoris: Secondary | ICD-10-CM

## 2021-06-19 DIAGNOSIS — I5022 Chronic systolic (congestive) heart failure: Secondary | ICD-10-CM | POA: Diagnosis not present

## 2021-06-19 DIAGNOSIS — E785 Hyperlipidemia, unspecified: Secondary | ICD-10-CM | POA: Diagnosis not present

## 2021-06-19 DIAGNOSIS — I447 Left bundle-branch block, unspecified: Secondary | ICD-10-CM

## 2021-06-19 DIAGNOSIS — I493 Ventricular premature depolarization: Secondary | ICD-10-CM | POA: Diagnosis not present

## 2021-06-23 DIAGNOSIS — H1033 Unspecified acute conjunctivitis, bilateral: Secondary | ICD-10-CM | POA: Diagnosis not present

## 2021-06-29 ENCOUNTER — Other Ambulatory Visit: Payer: Medicare Other

## 2021-07-09 DIAGNOSIS — I447 Left bundle-branch block, unspecified: Secondary | ICD-10-CM | POA: Diagnosis not present

## 2021-07-09 DIAGNOSIS — E785 Hyperlipidemia, unspecified: Secondary | ICD-10-CM | POA: Diagnosis not present

## 2021-07-09 DIAGNOSIS — I2510 Atherosclerotic heart disease of native coronary artery without angina pectoris: Secondary | ICD-10-CM | POA: Diagnosis not present

## 2021-07-09 DIAGNOSIS — I5022 Chronic systolic (congestive) heart failure: Secondary | ICD-10-CM | POA: Diagnosis not present

## 2021-07-13 ENCOUNTER — Other Ambulatory Visit: Payer: Self-pay

## 2021-07-13 MED ORDER — METOPROLOL SUCCINATE ER 50 MG PO TB24: 50.0000 mg | ORAL_TABLET | Freq: Every day | ORAL | 3 refills | Status: DC

## 2021-08-30 DIAGNOSIS — E1169 Type 2 diabetes mellitus with other specified complication: Secondary | ICD-10-CM | POA: Diagnosis not present

## 2021-08-30 DIAGNOSIS — E782 Mixed hyperlipidemia: Secondary | ICD-10-CM | POA: Diagnosis not present

## 2021-08-30 DIAGNOSIS — I119 Hypertensive heart disease without heart failure: Secondary | ICD-10-CM | POA: Diagnosis not present

## 2021-09-13 DIAGNOSIS — M7660 Achilles tendinitis, unspecified leg: Secondary | ICD-10-CM | POA: Insufficient documentation

## 2021-09-13 DIAGNOSIS — M67872 Other specified disorders of synovium, left ankle and foot: Secondary | ICD-10-CM | POA: Diagnosis not present

## 2021-09-16 DIAGNOSIS — N179 Acute kidney failure, unspecified: Secondary | ICD-10-CM | POA: Diagnosis not present

## 2021-09-30 ENCOUNTER — Other Ambulatory Visit: Payer: Self-pay | Admitting: Family Medicine

## 2021-09-30 ENCOUNTER — Other Ambulatory Visit (HOSPITAL_BASED_OUTPATIENT_CLINIC_OR_DEPARTMENT_OTHER): Payer: Self-pay | Admitting: Family Medicine

## 2021-09-30 DIAGNOSIS — N184 Chronic kidney disease, stage 4 (severe): Secondary | ICD-10-CM

## 2021-10-05 ENCOUNTER — Encounter (HOSPITAL_BASED_OUTPATIENT_CLINIC_OR_DEPARTMENT_OTHER): Payer: Self-pay

## 2021-10-05 ENCOUNTER — Ambulatory Visit (HOSPITAL_BASED_OUTPATIENT_CLINIC_OR_DEPARTMENT_OTHER): Payer: Medicare Other

## 2021-10-05 DIAGNOSIS — N184 Chronic kidney disease, stage 4 (severe): Secondary | ICD-10-CM | POA: Diagnosis not present

## 2021-10-07 ENCOUNTER — Ambulatory Visit (HOSPITAL_COMMUNITY)
Admission: RE | Admit: 2021-10-07 | Discharge: 2021-10-07 | Disposition: A | Discharge status: Home / Self Care | Payer: Medicare Other | Source: Ambulatory Visit | Attending: Cardiology | Admitting: Cardiology

## 2021-10-07 DIAGNOSIS — N184 Chronic kidney disease, stage 4 (severe): Secondary | ICD-10-CM | POA: Insufficient documentation

## 2021-10-20 ENCOUNTER — Ambulatory Visit
Admission: RE | Admit: 2021-10-20 | Discharge: 2021-10-20 | Disposition: A | Discharge status: Home / Self Care | Payer: Medicare Other | Source: Ambulatory Visit | Attending: Family Medicine | Admitting: Family Medicine

## 2021-10-20 DIAGNOSIS — Z780 Asymptomatic menopausal state: Secondary | ICD-10-CM | POA: Diagnosis not present

## 2021-10-20 DIAGNOSIS — M8589 Other specified disorders of bone density and structure, multiple sites: Secondary | ICD-10-CM | POA: Diagnosis not present

## 2021-10-20 DIAGNOSIS — M810 Age-related osteoporosis without current pathological fracture: Secondary | ICD-10-CM

## 2021-10-27 ENCOUNTER — Telehealth: Payer: Self-pay | Admitting: Cardiology

## 2021-10-27 NOTE — Telephone Encounter (Signed)
Patient called in to say that he dr took her off majority of her meds. Wanted to make the dr aware. Please adviee

## 2021-10-27 NOTE — Telephone Encounter (Signed)
Spoke to patient she stated her PCP stopped all her medications due to stage 4 kidney disease.She only takes Metoprolol and [place].She has appointment with Nephrology Dr.Peoples 9/5.She wanted Dr.Jordan to know.Advised he is out of office.I will make him aware.

## 2021-11-02 DIAGNOSIS — I129 Hypertensive chronic kidney disease with stage 1 through stage 4 chronic kidney disease, or unspecified chronic kidney disease: Secondary | ICD-10-CM | POA: Diagnosis not present

## 2021-11-02 DIAGNOSIS — N189 Chronic kidney disease, unspecified: Secondary | ICD-10-CM | POA: Diagnosis not present

## 2021-11-02 DIAGNOSIS — N184 Chronic kidney disease, stage 4 (severe): Secondary | ICD-10-CM | POA: Diagnosis not present

## 2021-11-02 DIAGNOSIS — E785 Hyperlipidemia, unspecified: Secondary | ICD-10-CM | POA: Diagnosis not present

## 2021-11-02 DIAGNOSIS — N179 Acute kidney failure, unspecified: Secondary | ICD-10-CM | POA: Diagnosis not present

## 2021-11-02 DIAGNOSIS — E1122 Type 2 diabetes mellitus with diabetic chronic kidney disease: Secondary | ICD-10-CM | POA: Diagnosis not present

## 2021-11-02 DIAGNOSIS — I447 Left bundle-branch block, unspecified: Secondary | ICD-10-CM | POA: Diagnosis not present

## 2021-11-02 DIAGNOSIS — N1831 Chronic kidney disease, stage 3a: Secondary | ICD-10-CM | POA: Diagnosis not present

## 2021-11-02 DIAGNOSIS — N390 Urinary tract infection, site not specified: Secondary | ICD-10-CM | POA: Diagnosis not present

## 2021-11-09 NOTE — Progress Notes (Signed)
Cardiology Office Note:    Date:  11/12/2021   ID:  Molly Carpenter, DOB 1950/07/06, MRN [id number]  PCP:  Molly Neer, MD  [facility] Cardiologist:  Molly Newnam [place], MD  [facility] Electrophysiologist:  None   Referring MD: Molly Neer, MD   Chief Complaint  Patient presents with   Congestive Heart Failure     History of Present Illness:    Molly Carpenter is a 71 y.o. female with a hx of CAD, HTN, HLD, DM II and LBBB.  Patient had a cardiac catheterization in 2002 that showed moderate disease in the septal perforator branch.  Myoview in 2012 showed fixed anterior apical and septal defect likely related to left bundle branch block, no ischemia was noted, EF 53%. Patient was on 05/22/2019 at which time she complained of more shortness of breath on exertion after she started exercising.  Echocardiogram performed on 06/04/2019 showed EF 40 to 45%, grade 2 DD.  Subsequent Myoview obtained on 07/12/2019 showed EF 40%, medium defect present in the apical anterior, apical septal, and apex location consistent with left bundle branch artifact, no evidence of ischemia.  Given LV dysfunction, carvedilol was added to her telmisartan for heart failure medication titration. She later developed hypotension and telmisartan dose was reduced. She was started on Jardiance.   She is enrolled in a study with [employer] that is looking at a drug that targets the IL-6 inflammasome.   On her last visit in October 2022 we repeated an Echo that showed no change. EF 35-40%. We added aldactone at that time. Repeat lab in November  showed increase in creatinine so aldactone was stopped.  She was having  symptoms of lightheadedness and feeling out of focus.  We had her wear an event monitor which did show frequent PVCs. Recommended switching from Coreg to Toprol XL for better suppression of PVCs.   In July  had worsening renal function with GFR down to 16. Her ASA, Crestor, telmisartan and metformin  were all stopped. She was referred to Nephrology. ARB stopped. Renal duplex was normal. Subsequent renal function improved back to her baseline with creatinine of 1.2. she is planning to see Molly Carpenter for her DM. BS have been 80-90 and she has been able to reduced her [place] dose. She actually feels very well. No chest pain, dyspnea, palpitations or dizziness.     Past Medical History:  Diagnosis Date   Chest tightness    Coronary artery disease    LHC 10/2000: dLAD 70-80% after a second septal perforator, EF 50%.  She was treated medically; Nuclear study 09/2010: EF 53%, fixed distal anterior, apical and septal defect most likely related to LBBB, no ischemia.    Diabetes mellitus    Hyperlipidemia    Hypertension    Hypotension    LBBB (left bundle branch block)     Past Surgical History:  Procedure Laterality Date   ABDOMINAL HYSTERECTOMY     CARDIAC CATHETERIZATION  11/21/2000   EF 55%. sHOWED A 70-80% STENOSIS IN THE SECOND SEPTAL PERFORATOR BRANCH.   CARDIOVASCULAR STRESS TEST  03/30/2006   EF 50%   CESAREAN SECTION     ROTATOR CUFF REPAIR     US ECHOCARDIOGRAPHY  09/25/2008   EF 50%    Current Medications: Current Meds  Medication Sig   aspirin EC 81 MG tablet Take 1 tablet (81 mg total) by mouth daily. Swallow whole.   empagliflozin (JARDIANCE) 10 MG TABS tablet Take 1 tablet (10 mg total)  by mouth daily before breakfast.   Insulin Degludec (TRESIBA FLEXTOUCH [place]) Inject into the skin. 16 units   metoprolol succinate (TOPROL XL) 50 MG 24 hr tablet Take 1 tablet (50 mg total) by mouth daily. Take with or immediately following a meal.   rosuvastatin (CRESTOR) 10 MG tablet Take 1 tablet (10 mg total) by mouth daily.     Allergies:   Patient has no known allergies.   Social History   Socioeconomic History   Marital status: Married    Spouse name: Not on file   Number of children: 2   Years of education: Not on file   Highest education level: Not on file  Occupational  History   Occupation: IT trainer    Comment: [employer]  Tobacco Use   Smoking status: Never   Smokeless tobacco: Never  Vaping Use   Vaping Use: Never used  Substance and Sexual Activity   Alcohol use: No    Alcohol/week: 0.0 standard drinks of alcohol   Drug use: No   Sexual activity: Not on file  Other Topics Concern   Not on file  Social History Narrative   Not on file   Social Determinants of Health   Financial Resource Strain: Not on file  Food Insecurity: Not on file  Transportation Needs: Not on file  Physical Activity: Not on file  Stress: Not on file  Social Connections: Not on file     Family History: The patient's family history includes Diabetes in her brother, brother, and sister; Heart attack in her brother and mother; Hypertension in her sister; Stroke in her father. There is no history of Breast cancer.  ROS:   Please see the history of present illness.     All other systems reviewed and are negative.  EKGs/Labs/Other Studies Reviewed:    The following studies were reviewed today:  Echo 06/04/2019  1. Left ventricular ejection fraction, by estimation, is 40 to 45%. The  left ventricle has mildly decreased function. The left ventricle  demonstrates global hypokinesis. There is mild concentric left ventricular  hypertrophy. Left ventricular diastolic  parameters are consistent with Grade II diastolic dysfunction  (pseudonormalization). Elevated left atrial pressure.   2. Right ventricular systolic function is normal. The right ventricular  size is normal. Tricuspid regurgitation signal is inadequate for assessing  PA pressure.   3. The mitral valve is grossly normal. No evidence of mitral valve  regurgitation. No evidence of mitral stenosis.   4. The aortic valve is grossly normal. Aortic valve regurgitation is not  visualized. No aortic stenosis is present.   5. The inferior vena cava is normal in size with greater than 50%  respiratory variability,  suggesting right atrial pressure of 3 mmHg.   Myoview 07/12/2019 The left ventricular ejection fraction is moderately decreased (30-44%). Nuclear stress EF: 40%. There was no ST segment deviation noted during stress. Defect 1: There is a medium defect present in the apical anterior, apical septal and apex location consistent with LBBB artifact and unchanged from prior. This is an intermediate risk study due to reduced systolic function. There is no evidence of ischemia.  Echo 12/16/20: IMPRESSIONS     1. Left ventricular ejection fraction, by estimation, is 35 to 40%. The  left ventricle has moderately decreased function. The left ventricle has  no regional wall motion abnormalities. Left ventricular diastolic  parameters are consistent with Grade I  diastolic dysfunction (impaired relaxation).   2. Right ventricular systolic function is normal. The right ventricular  size is normal. There is normal pulmonary artery systolic pressure. The  estimated right ventricular systolic pressure is 58.0 mmHg.   3. The mitral valve is normal in structure. Mild mitral valve  regurgitation. No evidence of mitral stenosis.   4. The aortic valve is normal in structure. Aortic valve regurgitation is  not visualized. No aortic stenosis is present.   5. The inferior vena cava is normal in size with greater than 50%  respiratory variability, suggesting right atrial pressure of 3 mmHg.   Comparison(s): No significant change from prior study. Prior images  reviewed side by side.   Event monitor 07/09/21: Study Highlights    Normal sinus rhythm Occasional PACs. one 6 beat run of SVT Frequent PVCs with burden 12.8%. some bigeminy and trigeminy     Patch Wear Time:  14 days and 0 hours (2023-04-22T17:39:14-0400 to 2023-05-06T17:39:18-0400)   Patient had a min HR of 50 bpm, max HR of 138 bpm, and avg HR of 68 bpm. Predominant underlying rhythm was Sinus Rhythm. Bundle Branch Block/IVCD was present. QRS  morphology changes were present throughout recording. 1 run of Supraventricular Tachycardia  occurred lasting 6 beats with a max rate of 138 bpm (avg 125 bpm). Isolated SVEs were occasional (1.4%, 19579), SVE Couplets were rare (<1.0%, 1377), and SVE Triplets were rare (<1.0%, 26). Isolated VEs were frequent (12.8%, [id number]), VE Couplets were  rare (<1.0%, 6592), and VE Triplets were rare (<1.0%, 916). Ventricular Bigeminy and Trigeminy were present.  EKG:  EKG is not ordered today.     Recent Labs: 01/01/2021: BUN 28; Creatinine, Ser 1.47; Potassium 4.2; Sodium 137  Recent Lipid Panel No results found for: "CHOL", "TRIG", "HDL", "CHOLHDL", "VLDL", "LDLCALC", "LDLDIRECT"   Dated 05/08/19: cholesterol 150, triglycerides 202, HDL 42, LDL 48.,  Dated 08/12/19: A1c 8%. Creatinine 1.11. otherwise CMET normal.  Dated 12/30/19: cholesterol 115, triglycerides 155, HDL 42, LDL 46. Creatinine 1.16. otherwise CMET normal Dated 03/31/20: A1c 7.1%.  Dated 07/01/20: A1c 7.5%. cholesterol  117, triglycerides 151, HDL 43, LDL 48. Creatinine 1.23. otherwise CMET normal.  Dated 01/11/21: cholesterol 111, triglycerides 174, HDL 41, LDL 42 Dated 05/10/21: A1c 8%. Creatinine 1.23. otherwise CMET normal. Dated 08/30/21: A1c 8.6%.  Dated 10/05/21: creatinine 2.7. BUN 37. Hgb 11.1. otherwise CMET, CBC, Magnesium normal.  Physical Exam:    VS:  BP 134/60   Pulse 70   Ht 5' 3.5" (1.613 m)   Wt 142 lb (64.4 kg)   SpO2 98%   BMI 24.76 kg/m     Wt Readings from Last 3 Encounters:  11/12/21 142 lb (64.4 kg)  06/17/21 155 lb 6.4 oz (70.5 kg)  12/07/20 155 lb 9.6 oz (70.6 kg)     GEN:  Well nourished, well developed in no acute distress HEENT: Normal NECK: No JVD; No carotid bruits LYMPHATICS: No lymphadenopathy CARDIAC: RRR, no murmurs, rubs, gallops RESPIRATORY:  Clear to auscultation without rales, wheezing or rhonchi  ABDOMEN: Soft, non-tender, non-distended MUSCULOSKELETAL:  No edema; No deformity  SKIN: Warm  and dry NEUROLOGIC:  Alert and oriented x 3 PSYCHIATRIC:  Normal affect   ASSESSMENT:    1. Chronic systolic CHF (congestive heart failure) ([place])   2. LBBB (left bundle branch block)   3. Coronary artery disease involving native coronary artery of native heart without angina pectoris   4. PVC's (premature ventricular contractions)   5. Hyperlipidemia LDL goal <70       PLAN:    In order of problems listed above:  1.  Chronic systolic CHF EF 00-37%. Class 1-2 symptoms. Unable to tolerate ARB or aldactone due to renal effects. Will continue Toprol. Since renal function has returned to baseline I think it is safe to resume Jardiance at 10 mg daily.  She is  Euvolemic. I suspect this is mostly related to dyssynchrony with LBBB.   2. CAD: Denies any recent chest pain.  Recommend she resume ASA 81 mg daily and will also resume Crestor at 10 mg daily (was on 40 mg). Plan to have repeat lab with Molly Brigitte Pulse when she sees her in November.   3. Hypertension: currently well controlled.   4. Hyperlipidemia: see #2.   5. DM2: Managed by primary care provider. On Tresiba only. Will resume lower dose of Jardiance for CHF indication. Follow up planned with Molly Carpenter.  6. Left bundle branch block: Chronic  7. 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 Asymptomatic currently. Continue Metoprolol    Medication Adjustments/Labs and Tests Ordered: Current medicines are reviewed at length with the patient today.  Concerns regarding medicines are outlined above.  No orders of the defined types were placed in this encounter.   Meds ordered this encounter  Medications   aspirin EC 81 MG tablet    Sig: Take 1 tablet (81 mg total) by mouth daily. Swallow whole.    Dispense:  90 tablet    Refill:  3   rosuvastatin (CRESTOR) 10 MG tablet    Sig: Take 1 tablet (10 mg total) by mouth daily.    Dispense:  90 tablet    Refill:  3   empagliflozin (JARDIANCE) 10 MG TABS tablet    Sig: Take 1 tablet (10 mg total) by  mouth daily before breakfast.    Dispense:  30 tablet    Refill:  6     Patient Instructions  Resume Crestor at 10 mg daily  Resume Jardiance at 10 mg daily or you can take half of the 25 mg tablets you have.  Resume ASA 81 mg daily    Follow up with me in 6 months  Signed, Molly Vanderkolk Martinique, MD  11/12/2021 8:49 AM    [facility]

## 2021-11-12 ENCOUNTER — Encounter: Payer: Self-pay | Admitting: Cardiology

## 2021-11-12 ENCOUNTER — Ambulatory Visit: Payer: Medicare Other | Attending: Cardiology | Admitting: Cardiology

## 2021-11-12 VITALS — BP 134/60 | HR 70 | Ht 63.5 in | Wt 142.0 lb

## 2021-11-12 DIAGNOSIS — I5022 Chronic systolic (congestive) heart failure: Secondary | ICD-10-CM

## 2021-11-12 DIAGNOSIS — I447 Left bundle-branch block, unspecified: Secondary | ICD-10-CM | POA: Diagnosis not present

## 2021-11-12 DIAGNOSIS — I493 Ventricular premature depolarization: Secondary | ICD-10-CM

## 2021-11-12 DIAGNOSIS — E785 Hyperlipidemia, unspecified: Secondary | ICD-10-CM | POA: Diagnosis not present

## 2021-11-12 DIAGNOSIS — I2510 Atherosclerotic heart disease of native coronary artery without angina pectoris: Secondary | ICD-10-CM

## 2021-11-12 MED ORDER — ROSUVASTATIN CALCIUM 10 MG PO TABS: 10.0000 mg | ORAL_TABLET | Freq: Every day | ORAL | 3 refills | Status: DC

## 2021-11-12 MED ORDER — ASPIRIN 81 MG PO TBEC: 81.0000 mg | DELAYED_RELEASE_TABLET | Freq: Every day | ORAL | 3 refills | Status: AC

## 2021-11-12 MED ORDER — ROSUVASTATIN CALCIUM 10 MG PO TABS: 10.0000 mg | ORAL_TABLET | Freq: Every day | ORAL | 6 refills | Status: DC

## 2021-11-12 MED ORDER — EMPAGLIFLOZIN 10 MG PO TABS: 10.0000 mg | ORAL_TABLET | Freq: Every day | ORAL | 6 refills | Status: DC

## 2021-11-12 NOTE — Addendum Note (Signed)
Addended by: Kathyrn Lass on: 11/12/2021 08:58 AM   Modules accepted: Orders

## 2021-11-12 NOTE — Patient Instructions (Signed)
Resume Crestor at 10 mg daily  Resume Jardiance at 10 mg daily or you can take half of the 25 mg tablets you have.  Resume ASA 81 mg daily

## 2021-12-01 DIAGNOSIS — I10 Essential (primary) hypertension: Secondary | ICD-10-CM | POA: Diagnosis not present

## 2021-12-01 DIAGNOSIS — E1169 Type 2 diabetes mellitus with other specified complication: Secondary | ICD-10-CM | POA: Diagnosis not present

## 2021-12-01 DIAGNOSIS — N184 Chronic kidney disease, stage 4 (severe): Secondary | ICD-10-CM | POA: Diagnosis not present

## 2021-12-01 DIAGNOSIS — E782 Mixed hyperlipidemia: Secondary | ICD-10-CM | POA: Diagnosis not present

## 2021-12-06 DIAGNOSIS — N1831 Chronic kidney disease, stage 3a: Secondary | ICD-10-CM | POA: Diagnosis not present

## 2021-12-13 DIAGNOSIS — N179 Acute kidney failure, unspecified: Secondary | ICD-10-CM | POA: Diagnosis not present

## 2021-12-13 DIAGNOSIS — I509 Heart failure, unspecified: Secondary | ICD-10-CM | POA: Diagnosis not present

## 2021-12-13 DIAGNOSIS — E1122 Type 2 diabetes mellitus with diabetic chronic kidney disease: Secondary | ICD-10-CM | POA: Diagnosis not present

## 2021-12-13 DIAGNOSIS — R768 Other specified abnormal immunological findings in serum: Secondary | ICD-10-CM | POA: Diagnosis not present

## 2021-12-13 DIAGNOSIS — E785 Hyperlipidemia, unspecified: Secondary | ICD-10-CM | POA: Diagnosis not present

## 2021-12-13 DIAGNOSIS — I129 Hypertensive chronic kidney disease with stage 1 through stage 4 chronic kidney disease, or unspecified chronic kidney disease: Secondary | ICD-10-CM | POA: Diagnosis not present

## 2021-12-13 DIAGNOSIS — N1831 Chronic kidney disease, stage 3a: Secondary | ICD-10-CM | POA: Diagnosis not present

## 2021-12-21 ENCOUNTER — Ambulatory Visit: Payer: Medicare Other | Admitting: Cardiology

## 2021-12-22 DIAGNOSIS — E782 Mixed hyperlipidemia: Secondary | ICD-10-CM | POA: Diagnosis not present

## 2021-12-22 DIAGNOSIS — E1122 Type 2 diabetes mellitus with diabetic chronic kidney disease: Secondary | ICD-10-CM | POA: Diagnosis not present

## 2021-12-22 DIAGNOSIS — I509 Heart failure, unspecified: Secondary | ICD-10-CM | POA: Diagnosis not present

## 2021-12-22 DIAGNOSIS — N1832 Chronic kidney disease, stage 3b: Secondary | ICD-10-CM | POA: Diagnosis not present

## 2021-12-22 DIAGNOSIS — K219 Gastro-esophageal reflux disease without esophagitis: Secondary | ICD-10-CM | POA: Diagnosis not present

## 2021-12-22 DIAGNOSIS — Z23 Encounter for immunization: Secondary | ICD-10-CM | POA: Diagnosis not present

## 2021-12-22 DIAGNOSIS — I10 Essential (primary) hypertension: Secondary | ICD-10-CM | POA: Diagnosis not present

## 2021-12-22 DIAGNOSIS — I447 Left bundle-branch block, unspecified: Secondary | ICD-10-CM | POA: Diagnosis not present

## 2021-12-30 ENCOUNTER — Encounter: Payer: Self-pay | Admitting: Cardiology

## 2021-12-30 MED ORDER — EMPAGLIFLOZIN 10 MG PO TABS: 10.0000 mg | ORAL_TABLET | Freq: Every day | ORAL | 6 refills | Status: DC

## 2021-12-30 NOTE — Telephone Encounter (Signed)
Samples of medication placed at the front desk

## 2022-01-31 ENCOUNTER — Encounter: Payer: Self-pay | Admitting: Cardiology

## 2022-03-03 DIAGNOSIS — R110 Nausea: Secondary | ICD-10-CM | POA: Diagnosis not present

## 2022-03-03 DIAGNOSIS — R197 Diarrhea, unspecified: Secondary | ICD-10-CM | POA: Diagnosis not present

## 2022-03-08 DIAGNOSIS — E11319 Type 2 diabetes mellitus with unspecified diabetic retinopathy without macular edema: Secondary | ICD-10-CM | POA: Diagnosis not present

## 2022-03-10 ENCOUNTER — Telehealth: Payer: Self-pay | Admitting: Cardiology

## 2022-03-10 NOTE — Telephone Encounter (Signed)
*  STAT* If patient is at the pharmacy, call can be transferred to refill team.   1. Which medications need to be refilled? (please list name of each medication and dose if known)   empagliflozin (JARDIANCE) 10 MG TABS tablet    2. Which pharmacy/location (including street and city if local pharmacy) is medication to be sent to? [facility] [place]   3. Do they need a 30 day or 90 day supply? 30 day supply

## 2022-03-11 ENCOUNTER — Encounter: Payer: Self-pay | Admitting: Cardiology

## 2022-03-11 ENCOUNTER — Other Ambulatory Visit: Payer: Self-pay | Admitting: Cardiology

## 2022-03-11 MED ORDER — EMPAGLIFLOZIN 10 MG PO TABS: 10.0000 mg | ORAL_TABLET | Freq: Every day | ORAL | 6 refills | Status: DC

## 2022-03-11 MED ORDER — EMPAGLIFLOZIN 10 MG PO TABS: 10.0000 mg | ORAL_TABLET | Freq: Every day | ORAL | 3 refills | Status: DC

## 2022-03-14 MED ORDER — EMPAGLIFLOZIN 10 MG PO TABS: 10.0000 mg | ORAL_TABLET | Freq: Every day | ORAL | 3 refills | Status: DC

## 2022-03-14 NOTE — Addendum Note (Signed)
Addended by: Rexanne Mano B on: 03/14/2022 10:42 AM   Modules accepted: Orders

## 2022-03-17 DIAGNOSIS — R197 Diarrhea, unspecified: Secondary | ICD-10-CM | POA: Diagnosis not present

## 2022-03-17 DIAGNOSIS — I10 Essential (primary) hypertension: Secondary | ICD-10-CM | POA: Diagnosis not present

## 2022-03-17 DIAGNOSIS — N1832 Chronic kidney disease, stage 3b: Secondary | ICD-10-CM | POA: Diagnosis not present

## 2022-03-17 DIAGNOSIS — E1122 Type 2 diabetes mellitus with diabetic chronic kidney disease: Secondary | ICD-10-CM | POA: Diagnosis not present

## 2022-03-17 DIAGNOSIS — E782 Mixed hyperlipidemia: Secondary | ICD-10-CM | POA: Diagnosis not present

## 2022-03-17 DIAGNOSIS — Z0000 Encounter for general adult medical examination without abnormal findings: Secondary | ICD-10-CM | POA: Diagnosis not present

## 2022-03-24 DIAGNOSIS — R748 Abnormal levels of other serum enzymes: Secondary | ICD-10-CM | POA: Diagnosis not present

## 2022-03-24 DIAGNOSIS — I509 Heart failure, unspecified: Secondary | ICD-10-CM | POA: Diagnosis not present

## 2022-03-24 DIAGNOSIS — K219 Gastro-esophageal reflux disease without esophagitis: Secondary | ICD-10-CM | POA: Diagnosis not present

## 2022-03-24 DIAGNOSIS — N1832 Chronic kidney disease, stage 3b: Secondary | ICD-10-CM | POA: Diagnosis not present

## 2022-03-24 DIAGNOSIS — E1122 Type 2 diabetes mellitus with diabetic chronic kidney disease: Secondary | ICD-10-CM | POA: Diagnosis not present

## 2022-03-24 DIAGNOSIS — I10 Essential (primary) hypertension: Secondary | ICD-10-CM | POA: Diagnosis not present

## 2022-03-24 DIAGNOSIS — E782 Mixed hyperlipidemia: Secondary | ICD-10-CM | POA: Diagnosis not present

## 2022-03-24 DIAGNOSIS — Z23 Encounter for immunization: Secondary | ICD-10-CM | POA: Diagnosis not present

## 2022-03-24 DIAGNOSIS — I447 Left bundle-branch block, unspecified: Secondary | ICD-10-CM | POA: Diagnosis not present

## 2022-04-04 DIAGNOSIS — E1169 Type 2 diabetes mellitus with other specified complication: Secondary | ICD-10-CM | POA: Diagnosis not present

## 2022-04-04 DIAGNOSIS — E782 Mixed hyperlipidemia: Secondary | ICD-10-CM | POA: Diagnosis not present

## 2022-04-11 DIAGNOSIS — E782 Mixed hyperlipidemia: Secondary | ICD-10-CM | POA: Diagnosis not present

## 2022-04-11 DIAGNOSIS — I10 Essential (primary) hypertension: Secondary | ICD-10-CM | POA: Diagnosis not present

## 2022-04-11 DIAGNOSIS — I2510 Atherosclerotic heart disease of native coronary artery without angina pectoris: Secondary | ICD-10-CM | POA: Diagnosis not present

## 2022-04-11 DIAGNOSIS — R001 Bradycardia, unspecified: Secondary | ICD-10-CM | POA: Diagnosis not present

## 2022-04-11 DIAGNOSIS — E1122 Type 2 diabetes mellitus with diabetic chronic kidney disease: Secondary | ICD-10-CM | POA: Diagnosis not present

## 2022-04-11 DIAGNOSIS — N1832 Chronic kidney disease, stage 3b: Secondary | ICD-10-CM | POA: Diagnosis not present

## 2022-06-02 DIAGNOSIS — N1831 Chronic kidney disease, stage 3a: Secondary | ICD-10-CM | POA: Diagnosis not present

## 2022-06-06 DIAGNOSIS — R748 Abnormal levels of other serum enzymes: Secondary | ICD-10-CM | POA: Diagnosis not present

## 2022-06-06 DIAGNOSIS — N1832 Chronic kidney disease, stage 3b: Secondary | ICD-10-CM | POA: Diagnosis not present

## 2022-06-06 DIAGNOSIS — I10 Essential (primary) hypertension: Secondary | ICD-10-CM | POA: Diagnosis not present

## 2022-06-06 DIAGNOSIS — E782 Mixed hyperlipidemia: Secondary | ICD-10-CM | POA: Diagnosis not present

## 2022-06-06 DIAGNOSIS — E1122 Type 2 diabetes mellitus with diabetic chronic kidney disease: Secondary | ICD-10-CM | POA: Diagnosis not present

## 2022-06-09 DIAGNOSIS — N1831 Chronic kidney disease, stage 3a: Secondary | ICD-10-CM | POA: Diagnosis not present

## 2022-06-09 DIAGNOSIS — E785 Hyperlipidemia, unspecified: Secondary | ICD-10-CM | POA: Diagnosis not present

## 2022-06-09 DIAGNOSIS — R768 Other specified abnormal immunological findings in serum: Secondary | ICD-10-CM | POA: Diagnosis not present

## 2022-06-09 DIAGNOSIS — E1122 Type 2 diabetes mellitus with diabetic chronic kidney disease: Secondary | ICD-10-CM | POA: Diagnosis not present

## 2022-06-09 DIAGNOSIS — I129 Hypertensive chronic kidney disease with stage 1 through stage 4 chronic kidney disease, or unspecified chronic kidney disease: Secondary | ICD-10-CM | POA: Diagnosis not present

## 2022-06-09 DIAGNOSIS — I509 Heart failure, unspecified: Secondary | ICD-10-CM | POA: Diagnosis not present

## 2022-06-13 DIAGNOSIS — E1122 Type 2 diabetes mellitus with diabetic chronic kidney disease: Secondary | ICD-10-CM | POA: Diagnosis not present

## 2022-06-13 DIAGNOSIS — Z23 Encounter for immunization: Secondary | ICD-10-CM | POA: Diagnosis not present

## 2022-06-13 DIAGNOSIS — K219 Gastro-esophageal reflux disease without esophagitis: Secondary | ICD-10-CM | POA: Diagnosis not present

## 2022-06-13 DIAGNOSIS — I447 Left bundle-branch block, unspecified: Secondary | ICD-10-CM | POA: Diagnosis not present

## 2022-06-13 DIAGNOSIS — I10 Essential (primary) hypertension: Secondary | ICD-10-CM | POA: Diagnosis not present

## 2022-06-13 DIAGNOSIS — E782 Mixed hyperlipidemia: Secondary | ICD-10-CM | POA: Diagnosis not present

## 2022-06-13 DIAGNOSIS — R748 Abnormal levels of other serum enzymes: Secondary | ICD-10-CM | POA: Diagnosis not present

## 2022-06-13 DIAGNOSIS — I509 Heart failure, unspecified: Secondary | ICD-10-CM | POA: Diagnosis not present

## 2022-06-13 DIAGNOSIS — N1832 Chronic kidney disease, stage 3b: Secondary | ICD-10-CM | POA: Diagnosis not present

## 2022-06-13 DIAGNOSIS — H1031 Unspecified acute conjunctivitis, right eye: Secondary | ICD-10-CM | POA: Diagnosis not present

## 2022-08-12 DIAGNOSIS — E1122 Type 2 diabetes mellitus with diabetic chronic kidney disease: Secondary | ICD-10-CM | POA: Diagnosis not present

## 2022-08-12 DIAGNOSIS — M79672 Pain in left foot: Secondary | ICD-10-CM | POA: Insufficient documentation

## 2022-08-12 DIAGNOSIS — E782 Mixed hyperlipidemia: Secondary | ICD-10-CM | POA: Diagnosis not present

## 2022-08-12 DIAGNOSIS — N1832 Chronic kidney disease, stage 3b: Secondary | ICD-10-CM | POA: Diagnosis not present

## 2022-08-12 DIAGNOSIS — E1169 Type 2 diabetes mellitus with other specified complication: Secondary | ICD-10-CM | POA: Diagnosis not present

## 2022-08-22 ENCOUNTER — Other Ambulatory Visit: Payer: Self-pay

## 2022-08-25 NOTE — Progress Notes (Addendum)
Cardiology Office Note:    Date:  08/29/2022   ID:  Molly Carpenter, DOB 1950/08/23, MRN [id number]  PCP:  Molly Raider, MD  [facility] Cardiologist:  Molly Beever [place], MD  [facility] Electrophysiologist:  None   Referring MD: Molly Raider, MD   Chief Complaint  Patient presents with   Congestive Heart Failure     History of Present Illness:    Molly Carpenter is a 72 y.o. female with a hx of CAD, HTN, HLD, DM II and LBBB.  Patient had a cardiac catheterization in 2002 that showed moderate disease in the septal perforator branch.  Myoview in 2012 showed fixed anterior apical and septal defect likely related to left bundle branch block, no ischemia was noted, EF 53%. Patient was on 05/22/2019 at which time she complained of more shortness of breath on exertion after she started exercising.  Echocardiogram performed on 06/04/2019 showed EF 40 to 45%, grade 2 DD.  Subsequent Myoview obtained on 07/12/2019 showed EF 40%, medium defect present in the apical anterior, apical septal, and apex location consistent with left bundle branch artifact, no evidence of ischemia.  Given LV dysfunction, carvedilol was added to her telmisartan for heart failure medication titration. She later developed hypotension and telmisartan dose was reduced. She was started on Jardiance.   She is enrolled in a study with [facility] that is looking at a drug that targets the IL-6 inflammasome.   On her last visit in October 2022 we repeated an Echo that showed no change. EF 35-40%. We added aldactone at that time. Repeat lab in November  showed increase in creatinine so aldactone was stopped.  She was having  symptoms of lightheadedness and feeling out of focus.  We had her wear an event monitor which did show frequent PVCs. Recommended switching from Coreg to Toprol XL for better suppression of PVCs.   In July  had worsening renal function with GFR down to 16. Her ASA, Crestor, telmisartan and metformin  were all stopped. She was referred to Nephrology. ARB stopped. Renal duplex was normal. Subsequent renal function improved back to her baseline with creatinine of 1.2. she sees Dr Molly Carpenter for her DM.   She feels well today. Notes occasional swelling in her feet late in the day. Has some chest pressure when lying down- only when stressed. Is active watching her grandchildren play baseball. Does eat a fair amount of salty snacks.   Past Medical History:  Diagnosis Date   Chest tightness    Coronary artery disease    LHC 10/2000: dLAD 70-80% after a second septal perforator, EF 50%.  She was treated medically; Nuclear study 09/2010: EF 53%, fixed distal anterior, apical and septal defect most likely related to LBBB, no ischemia.    Diabetes mellitus    Hyperlipidemia    Hypertension    Hypotension    LBBB (left bundle branch block)     Past Surgical History:  Procedure Laterality Date   ABDOMINAL HYSTERECTOMY     CARDIAC CATHETERIZATION  11/21/2000   EF 55%. sHOWED A 70-80% STENOSIS IN THE SECOND SEPTAL PERFORATOR BRANCH.   CARDIOVASCULAR STRESS TEST  03/30/2006   EF 50%   CESAREAN SECTION     ROTATOR CUFF REPAIR     US ECHOCARDIOGRAPHY  09/25/2008   EF 50%    Current Medications: Current Meds  Medication Sig   aspirin EC 81 MG tablet Take 1 tablet (81 mg total) by mouth daily. Swallow whole.   Blood Glucose  Monitoring Suppl (ACCU-CHEK GUIDE) w/Device KIT    cholecalciferol (VITAMIN D) 1000 UNITS tablet Take 2,000 Units by mouth daily.   empagliflozin (JARDIANCE) 10 MG TABS tablet Take 1 tablet (10 mg total) by mouth daily before breakfast.   Insulin Degludec (TRESIBA FLEXTOUCH [place]) Inject into the skin. 16 units   metoprolol succinate (TOPROL XL) 50 MG 24 hr tablet Take 1 tablet (50 mg total) by mouth daily. Take with or immediately following a meal.   Multiple Vitamin (MULTI VITAMIN) TABS    rosuvastatin (CRESTOR) 10 MG tablet Take 1 tablet (10 mg total) by mouth daily.      Allergies:   Patient has no known allergies.   Social History   Socioeconomic History   Marital status: Married    Spouse name: Not on file   Number of children: 2   Years of education: Not on file   Highest education level: Not on file  Occupational History   Occupation: Secondary school teacher    Comment: [employer]  Tobacco Use   Smoking status: Never   Smokeless tobacco: Never  Vaping Use   Vaping Use: Never used  Substance and Sexual Activity   Alcohol use: No    Alcohol/week: 0.0 standard drinks of alcohol   Drug use: No   Sexual activity: Not on file  Other Topics Concern   Not on file  Social History Narrative   Not on file   Social Determinants of Health   Financial Resource Strain: Not on file  Food Insecurity: Not on file  Transportation Needs: Not on file  Physical Activity: Not on file  Stress: Not on file  Social Connections: Not on file     Family History: The patient's family history includes Diabetes in her brother, brother, and sister; Heart attack in her brother and mother; Hypertension in her sister; Stroke in her father. There is no history of Breast cancer.  ROS:   Please see the history of present illness.     All other systems reviewed and are negative.  EKGs/Labs/Other Studies Reviewed:    The following studies were reviewed today:  Echo 06/04/2019  1. Left ventricular ejection fraction, by estimation, is 40 to 45%. The  left ventricle has mildly decreased function. The left ventricle  demonstrates global hypokinesis. There is mild concentric left ventricular  hypertrophy. Left ventricular diastolic  parameters are consistent with Grade II diastolic dysfunction  (pseudonormalization). Elevated left atrial pressure.   2. Right ventricular systolic function is normal. The right ventricular  size is normal. Tricuspid regurgitation signal is inadequate for assessing  PA pressure.   3. The mitral valve is grossly normal. No evidence of mitral valve   regurgitation. No evidence of mitral stenosis.   4. The aortic valve is grossly normal. Aortic valve regurgitation is not  visualized. No aortic stenosis is present.   5. The inferior vena cava is normal in size with greater than 50%  respiratory variability, suggesting right atrial pressure of 3 mmHg.   Myoview 07/12/2019 The left ventricular ejection fraction is moderately decreased (30-44%). Nuclear stress EF: 40%. There was no ST segment deviation noted during stress. Defect 1: There is a medium defect present in the apical anterior, apical septal and apex location consistent with LBBB artifact and unchanged from prior. This is an intermediate risk study due to reduced systolic function. There is no evidence of ischemia.  Echo 12/16/20: IMPRESSIONS     1. Left ventricular ejection fraction, by estimation, is 35 to 40%. The  left ventricle has moderately decreased function. The left ventricle has  no regional wall motion abnormalities. Left ventricular diastolic  parameters are consistent with Grade I  diastolic dysfunction (impaired relaxation).   2. Right ventricular systolic function is normal. The right ventricular  size is normal. There is normal pulmonary artery systolic pressure. The  estimated right ventricular systolic pressure is 13.0 mmHg.   3. The mitral valve is normal in structure. Mild mitral valve  regurgitation. No evidence of mitral stenosis.   4. The aortic valve is normal in structure. Aortic valve regurgitation is  not visualized. No aortic stenosis is present.   5. The inferior vena cava is normal in size with greater than 50%  respiratory variability, suggesting right atrial pressure of 3 mmHg.   Comparison(s): No significant change from prior study. Prior images  reviewed side by side.   Event monitor 07/09/21: Study Highlights    Normal sinus rhythm Occasional PACs. one 6 beat run of SVT Frequent PVCs with burden 12.8%. some bigeminy and  trigeminy     Patch Wear Time:  14 days and 0 hours (2023-04-22T17:39:14-0400 to 2023-05-06T17:39:18-0400)   Patient had a min HR of 50 bpm, max HR of 138 bpm, and avg HR of 68 bpm. Predominant underlying rhythm was Sinus Rhythm. Bundle Branch Block/IVCD was present. QRS morphology changes were present throughout recording. 1 run of Supraventricular Tachycardia  occurred lasting 6 beats with a max rate of 138 bpm (avg 125 bpm). Isolated SVEs were occasional (1.4%, 19579), SVE Couplets were rare (<1.0%, 1377), and SVE Triplets were rare (<1.0%, 26). Isolated VEs were frequent (12.8%, [id number]), VE Couplets were  rare (<1.0%, 6592), and VE Triplets were rare (<1.0%, 916). Ventricular Bigeminy and Trigeminy were present.  EKG Interpretation Date/Time:  Monday August 29 2022 08:01:21 EDT Ventricular Rate:  66 PR Interval:  198 QRS Duration:  152 QT Interval:  466 QTC Calculation: 488 R Axis:   -44  Text Interpretation: Sinus rhythm with occasional Premature ventricular complexes Left axis deviation Left bundle branch block When compared with ECG of 16-Jun-2004 09:11, Premature ventricular complexes are now Present Confirmed by Carpenter, Lanijah Warzecha [phone]) on 08/29/2022 8:31:51 AM      Recent Labs: No results found for requested labs within last 365 days.  Recent Lipid Panel No results found for: "CHOL", "TRIG", "HDL", "CHOLHDL", "VLDL", "LDLCALC", "LDLDIRECT"   Dated 05/08/19: cholesterol 150, triglycerides 202, HDL 42, LDL 48.,  Dated 08/12/19: A1c 8%. Creatinine 1.11. otherwise CMET normal.  Dated 12/30/19: cholesterol 115, triglycerides 155, HDL 42, LDL 46. Creatinine 1.16. otherwise CMET normal Dated 03/31/20: A1c 7.1%.  Dated 07/01/20: A1c 7.5%. cholesterol  117, triglycerides 151, HDL 43, LDL 48. Creatinine 1.23. otherwise CMET normal.  Dated 01/11/21: cholesterol 111, triglycerides 174, HDL 41, LDL 42 Dated 05/10/21: A1c 8%. Creatinine 1.23. otherwise CMET normal. Dated 08/30/21: A1c 8.6%.  Dated  10/05/21: creatinine 2.7. BUN 37. Hgb 11.1. otherwise CMET, CBC, Magnesium normal. Dated 11/02/21: normal CBC and chemistries Dated 12/01/21: A1c 5.6%.  Dated 04/04/22: cholesterol 109, HDL 37, LDL 45 Dated 06/06/22: triglycerides 117  Physical Exam:    VS:  BP 134/84   Pulse 66   Ht 5\' 4"  (1.626 m)   Wt 146 lb (66.2 kg)   SpO2 96%   BMI 25.06 kg/m     Wt Readings from Last 3 Encounters:  08/29/22 146 lb (66.2 kg)  11/12/21 142 lb (64.4 kg)  06/17/21 155 lb 6.4 oz (70.5 kg)     GEN:  Well nourished, well developed in no acute distress HEENT: Normal NECK: No JVD; No carotid bruits LYMPHATICS: No lymphadenopathy CARDIAC: RRR, no murmurs, rubs, gallops RESPIRATORY:  Clear to auscultation without rales, wheezing or rhonchi  ABDOMEN: Soft, non-tender, non-distended MUSCULOSKELETAL:  No edema; No deformity  SKIN: Warm and dry NEUROLOGIC:  Alert and oriented x 3 PSYCHIATRIC:  Normal affect   ASSESSMENT:    1. Coronary artery disease involving native coronary artery of native heart without angina pectoris   2. LBBB (left bundle branch block)   3. Primary hypertension        PLAN:    In order of problems listed above:  1.   Chronic systolic CHF EF 35-40%. Class 1-2 symptoms. Unable to tolerate ARB or aldactone due to renal effects. Will continue Toprol. She is on Jardiance at 10 mg daily.  She is  Euvolemic. I suspect that  dyssynchrony with LBBB is playing a role in this.   2. CAD: Class 1 angina. Continue ASA 81 mg daily and Crestor 10 mg daily.  3. Hypertension: currently well controlled.   4. Hyperlipidemia: see #2. LDL is at goal  5. DM2: Managed by primary care provider. On Tresiba and Jardiance.  only.   6. Left bundle branch block: Chronic  7. 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 Asymptomatic currently. Continue Metoprolol    Medication Adjustments/Labs and Tests Ordered: Current medicines are reviewed at length with the patient today.  Concerns regarding medicines  are outlined above.  Orders Placed This Encounter  Procedures   EKG 12-Lead    No orders of the defined types were placed in this encounter.    There are no Patient Instructions on file for this visit.  Follow up with me in 6 months  Signed, Arlys Scatena [place], MD  08/29/2022 8:25 AM    [facility]

## 2022-08-29 ENCOUNTER — Ambulatory Visit: Payer: Medicare Other | Attending: Cardiology | Admitting: Cardiology

## 2022-08-29 ENCOUNTER — Encounter: Payer: Self-pay | Admitting: Cardiology

## 2022-08-29 VITALS — BP 134/84 | HR 66 | Ht 64.0 in | Wt 146.0 lb

## 2022-08-29 DIAGNOSIS — I2510 Atherosclerotic heart disease of native coronary artery without angina pectoris: Secondary | ICD-10-CM

## 2022-08-29 DIAGNOSIS — I447 Left bundle-branch block, unspecified: Secondary | ICD-10-CM | POA: Diagnosis not present

## 2022-08-29 DIAGNOSIS — E1169 Type 2 diabetes mellitus with other specified complication: Secondary | ICD-10-CM | POA: Diagnosis not present

## 2022-08-29 DIAGNOSIS — E782 Mixed hyperlipidemia: Secondary | ICD-10-CM | POA: Diagnosis not present

## 2022-08-29 DIAGNOSIS — I10 Essential (primary) hypertension: Secondary | ICD-10-CM

## 2022-08-29 DIAGNOSIS — E1122 Type 2 diabetes mellitus with diabetic chronic kidney disease: Secondary | ICD-10-CM | POA: Diagnosis not present

## 2022-08-29 DIAGNOSIS — N1832 Chronic kidney disease, stage 3b: Secondary | ICD-10-CM | POA: Diagnosis not present

## 2022-08-29 MED ORDER — EMPAGLIFLOZIN 10 MG PO TABS: 10.0000 mg | ORAL_TABLET | Freq: Every day | ORAL | 0 refills | Status: DC

## 2022-08-29 MED ORDER — ROSUVASTATIN CALCIUM 10 MG PO TABS: 10.0000 mg | ORAL_TABLET | Freq: Every day | ORAL | 3 refills | Status: DC

## 2022-08-29 NOTE — Addendum Note (Signed)
Addended by: Neoma Laming on: 08/29/2022 08:36 AM   Modules accepted: Orders

## 2022-08-29 NOTE — Patient Instructions (Signed)
Medication Instructions:  Continue same medications *If you need a refill on your cardiac medications before your next appointment, please call your pharmacy*   Lab Work: None ordered   Testing/Procedures: None ordered   Follow-Up: At  [facility]Care, you and your health needs are our priority.  As part of our continuing mission to provide you with exceptional heart care, we have created designated Provider Care Teams.  These Care Teams include your primary Cardiologist (physician) and Advanced Practice Providers (APPs -  Physician Assistants and Nurse Practitioners) who all work together to provide you with the care you need, when you need it.  We recommend signing up for the patient portal called "MyChart".  Sign up information is provided on this After Visit Summary.  MyChart is used to connect with patients for Virtual Visits (Telemedicine).  Patients are able to view lab/test results, encounter notes, upcoming appointments, etc.  Non-urgent messages can be sent to your provider as well.   To learn more about what you can do with MyChart, go to [email or web address].    Your next appointment:  6 months    Call in Sept to schedule Jan appointment     Provider:  Dr.Jordan   

## 2022-09-05 ENCOUNTER — Telehealth: Payer: Self-pay | Admitting: Cardiology

## 2022-09-05 DIAGNOSIS — E1169 Type 2 diabetes mellitus with other specified complication: Secondary | ICD-10-CM | POA: Diagnosis not present

## 2022-09-05 DIAGNOSIS — I10 Essential (primary) hypertension: Secondary | ICD-10-CM | POA: Diagnosis not present

## 2022-09-05 DIAGNOSIS — E782 Mixed hyperlipidemia: Secondary | ICD-10-CM | POA: Diagnosis not present

## 2022-09-05 DIAGNOSIS — E1122 Type 2 diabetes mellitus with diabetic chronic kidney disease: Secondary | ICD-10-CM | POA: Diagnosis not present

## 2022-09-05 NOTE — Telephone Encounter (Signed)
*  STAT* If patient is at the pharmacy, call can be transferred to refill team.   1. Which medications need to be refilled? (please list name of each medication and dose if known) rosuvastatin (CRESTOR) 10 MG tablet   2. Which pharmacy/location (including street and city if local pharmacy) is medication to be sent to?   [facility] [place] - [address] AT [address]AL TO [address]    3. Do they need a 30 day or 90 day supply? 90   Pt states [facility] called [facility] and they told her they did not receive refill request.

## 2022-09-07 ENCOUNTER — Telehealth: Payer: Self-pay | Admitting: Cardiology

## 2022-09-07 MED ORDER — ROSUVASTATIN CALCIUM 10 MG PO TABS: 10.0000 mg | ORAL_TABLET | Freq: Every day | ORAL | 3 refills | Status: DC

## 2022-09-07 NOTE — Telephone Encounter (Signed)
Patient is calling to talk with Dr. [place] or nurse about the medication rosuvastatin (CRESTOR) 10 MG tablet

## 2022-09-07 NOTE — Telephone Encounter (Signed)
Spoke to the pt, advise pt we forward the message to [place], LPN. Pt voiced understanding.

## 2022-09-07 NOTE — Telephone Encounter (Signed)
Spoke to patient she requested 90 day refill for Crestor 10 mg.Refill sent to [facility] on [address].

## 2022-09-12 DIAGNOSIS — M6702 Short Achilles tendon (acquired), left ankle: Secondary | ICD-10-CM | POA: Diagnosis not present

## 2022-09-12 DIAGNOSIS — M67872 Other specified disorders of synovium, left ankle and foot: Secondary | ICD-10-CM | POA: Diagnosis not present

## 2022-09-12 DIAGNOSIS — M7660 Achilles tendinitis, unspecified leg: Secondary | ICD-10-CM | POA: Insufficient documentation

## 2022-09-12 DIAGNOSIS — M7662 Achilles tendinitis, left leg: Secondary | ICD-10-CM | POA: Diagnosis not present

## 2022-11-02 ENCOUNTER — Telehealth: Payer: Self-pay | Admitting: Cardiology

## 2022-11-02 ENCOUNTER — Other Ambulatory Visit: Payer: Self-pay

## 2022-11-02 MED ORDER — EMPAGLIFLOZIN 10 MG PO TABS: 10.0000 mg | ORAL_TABLET | Freq: Every day | ORAL | 0 refills | Status: DC

## 2022-11-02 NOTE — Telephone Encounter (Signed)
Returned call to pt. Informed pt. There are samples at the front desk for her to pick up. Pt verbalized understanding. Included application for assistance and a coupon.

## 2022-11-02 NOTE — Telephone Encounter (Signed)
Jardiance 10mg  lot number is [id number] Exp. 02/2024 x14 tablets.

## 2022-11-02 NOTE — Telephone Encounter (Signed)
Patient calling the office for samples of medication:   1.  What medication and dosage are you requesting samples for? empagliflozin (JARDIANCE) 10 MG TABS tablet  2.  Are you currently out of this medication?   Yes. Patient states she is currently in the donut hole and her medication will cost her $200+.

## 2022-12-20 DIAGNOSIS — I10 Essential (primary) hypertension: Secondary | ICD-10-CM | POA: Diagnosis not present

## 2022-12-20 DIAGNOSIS — E1122 Type 2 diabetes mellitus with diabetic chronic kidney disease: Secondary | ICD-10-CM | POA: Diagnosis not present

## 2022-12-20 DIAGNOSIS — E782 Mixed hyperlipidemia: Secondary | ICD-10-CM | POA: Diagnosis not present

## 2022-12-20 DIAGNOSIS — R748 Abnormal levels of other serum enzymes: Secondary | ICD-10-CM | POA: Diagnosis not present

## 2022-12-20 DIAGNOSIS — N1832 Chronic kidney disease, stage 3b: Secondary | ICD-10-CM | POA: Diagnosis not present

## 2022-12-27 DIAGNOSIS — E1122 Type 2 diabetes mellitus with diabetic chronic kidney disease: Secondary | ICD-10-CM | POA: Diagnosis not present

## 2022-12-27 DIAGNOSIS — I10 Essential (primary) hypertension: Secondary | ICD-10-CM | POA: Diagnosis not present

## 2022-12-27 DIAGNOSIS — I509 Heart failure, unspecified: Secondary | ICD-10-CM | POA: Diagnosis not present

## 2022-12-27 DIAGNOSIS — I447 Left bundle-branch block, unspecified: Secondary | ICD-10-CM | POA: Diagnosis not present

## 2022-12-27 DIAGNOSIS — N1832 Chronic kidney disease, stage 3b: Secondary | ICD-10-CM | POA: Diagnosis not present

## 2022-12-27 DIAGNOSIS — E782 Mixed hyperlipidemia: Secondary | ICD-10-CM | POA: Diagnosis not present

## 2022-12-27 DIAGNOSIS — R748 Abnormal levels of other serum enzymes: Secondary | ICD-10-CM | POA: Diagnosis not present

## 2022-12-27 DIAGNOSIS — Z0000 Encounter for general adult medical examination without abnormal findings: Secondary | ICD-10-CM | POA: Diagnosis not present

## 2022-12-27 DIAGNOSIS — K219 Gastro-esophageal reflux disease without esophagitis: Secondary | ICD-10-CM | POA: Diagnosis not present

## 2023-01-10 DIAGNOSIS — E1122 Type 2 diabetes mellitus with diabetic chronic kidney disease: Secondary | ICD-10-CM | POA: Diagnosis not present

## 2023-01-10 DIAGNOSIS — I509 Heart failure, unspecified: Secondary | ICD-10-CM | POA: Diagnosis not present

## 2023-01-10 DIAGNOSIS — N1832 Chronic kidney disease, stage 3b: Secondary | ICD-10-CM | POA: Diagnosis not present

## 2023-01-10 DIAGNOSIS — E782 Mixed hyperlipidemia: Secondary | ICD-10-CM | POA: Diagnosis not present

## 2023-01-10 DIAGNOSIS — I10 Essential (primary) hypertension: Secondary | ICD-10-CM | POA: Diagnosis not present

## 2023-01-28 NOTE — Progress Notes (Unsigned)
Cardiology Office Note:    Date:  02/01/2023   ID:  Molly Carpenter, DOB 1950/03/31, MRN [id number]  PCP:  Lupita Raider, MD  [facility] Cardiologist:  Erendira Crabtree [place], MD  [facility] Electrophysiologist:  None   Referring MD: Lupita Raider, MD   Chief Complaint  Patient presents with   Coronary Artery Disease     History of Present Illness:    Molly Carpenter is a 72 y.o. female with a hx of CAD, HTN, HLD, DM II and LBBB.  Patient had a cardiac catheterization in 2002 that showed moderate disease in the septal perforator branch.  Myoview in 2012 showed fixed anterior apical and septal defect likely related to left bundle branch block, no ischemia was noted, EF 53%. Patient was on 05/22/2019 at which time she complained of more shortness of breath on exertion after she started exercising.  Echocardiogram performed on 06/04/2019 showed EF 40 to 45%, grade 2 DD.  Subsequent Myoview obtained on 07/12/2019 showed EF 40%, medium defect present in the apical anterior, apical septal, and apex location consistent with left bundle branch artifact, no evidence of ischemia.  Given LV dysfunction, carvedilol was added to her telmisartan for heart failure medication titration. She later developed hypotension and telmisartan dose was reduced. She was started on Jardiance.   She is enrolled in a study with [employer] that is looking at a drug that targets the IL-6 inflammasome.   In October 2022 we repeated an Echo that showed no change. EF 35-40%. We added aldactone at that time. Repeat lab in November  showed increase in creatinine so aldactone was stopped.  She was having  symptoms of lightheadedness and feeling out of focus.  We had her wear an event monitor which did show frequent PVCs. Recommended switching from Coreg to Toprol XL for better suppression of PVCs.   In July  had worsening renal function with GFR down to 16. Her ASA, Crestor, telmisartan and metformin were all stopped.  She was referred to Nephrology. ARB stopped. Renal duplex was normal. Subsequent renal function improved back to her baseline with creatinine of 1.2. she sees Dr Talmage Nap for her DM.   On follow up today she notes she has had a stressful year. Her husband was diagnosed with lymphoma and has been getting chemotherapy. She notes she gets some random sensation of pushing in her chest when stressed. Not with exertion. She wasn't doing any walking for 6 months but is getting back to it. Notes some SOB with exertion. No edema. Weight is up 6 lbs. Was switched from [place] to insulin. Reports recent labs with PCP.    Past Medical History:  Diagnosis Date   Chest tightness    Coronary artery disease    LHC 10/2000: dLAD 70-80% after a second septal perforator, EF 50%.  She was treated medically; Nuclear study 09/2010: EF 53%, fixed distal anterior, apical and septal defect most likely related to LBBB, no ischemia.    Diabetes mellitus    Hyperlipidemia    Hypertension    Hypotension    LBBB (left bundle branch block)     Past Surgical History:  Procedure Laterality Date   ABDOMINAL HYSTERECTOMY     CARDIAC CATHETERIZATION  11/21/2000   EF 55%. sHOWED A 70-80% STENOSIS IN THE SECOND SEPTAL PERFORATOR BRANCH.   CARDIOVASCULAR STRESS TEST  03/30/2006   EF 50%   CESAREAN SECTION     ROTATOR CUFF REPAIR     US ECHOCARDIOGRAPHY  09/25/2008  EF 50%    Current Medications: Current Meds  Medication Sig   aspirin EC 81 MG tablet Take 1 tablet (81 mg total) by mouth daily. Swallow whole.   Blood Glucose Monitoring Suppl (ACCU-CHEK GUIDE) w/Device KIT    cholecalciferol (VITAMIN D) 1000 UNITS tablet Take 2,000 Units by mouth daily.   empagliflozin (JARDIANCE) 10 MG TABS tablet Take 1 tablet (10 mg total) by mouth daily before breakfast.   HUMALOG MIX 75/25 KWIKPEN (75-25) 100 UNIT/ML KwikPen Inject into the skin.   metoprolol succinate (TOPROL XL) 50 MG 24 hr tablet Take 1 tablet (50 mg total) by mouth  daily. Take with or immediately following a meal.   Multiple Vitamin (MULTI VITAMIN) TABS    rosuvastatin (CRESTOR) 10 MG tablet Take 1 tablet (10 mg total) by mouth daily.     Allergies:   Patient has no known allergies.   Social History   Socioeconomic History   Marital status: Married    Spouse name: Not on file   Number of children: 2   Years of education: Not on file   Highest education level: Not on file  Occupational History   Occupation: Secondary school teacher    Comment: [employer]  Tobacco Use   Smoking status: Never   Smokeless tobacco: Never  Vaping Use   Vaping status: Never Used  Substance and Sexual Activity   Alcohol use: No    Alcohol/week: 0.0 standard drinks of alcohol   Drug use: No   Sexual activity: Not on file  Other Topics Concern   Not on file  Social History Narrative   Not on file   Social Determinants of Health   Financial Resource Strain: Not on file  Food Insecurity: Not on file  Transportation Needs: Not on file  Physical Activity: Not on file  Stress: Not on file  Social Connections: Not on file     Family History: The patient's family history includes Diabetes in her brother, brother, and sister; Heart attack in her brother and mother; Hypertension in her sister; Stroke in her father. There is no history of Breast cancer.  ROS:   Please see the history of present illness.     All other systems reviewed and are negative.  EKGs/Labs/Other Studies Reviewed:    The following studies were reviewed today:  Echo 06/04/2019  1. Left ventricular ejection fraction, by estimation, is 40 to 45%. The  left ventricle has mildly decreased function. The left ventricle  demonstrates global hypokinesis. There is mild concentric left ventricular  hypertrophy. Left ventricular diastolic  parameters are consistent with Grade II diastolic dysfunction  (pseudonormalization). Elevated left atrial pressure.   2. Right ventricular systolic function is normal. The  right ventricular  size is normal. Tricuspid regurgitation signal is inadequate for assessing  PA pressure.   3. The mitral valve is grossly normal. No evidence of mitral valve  regurgitation. No evidence of mitral stenosis.   4. The aortic valve is grossly normal. Aortic valve regurgitation is not  visualized. No aortic stenosis is present.   5. The inferior vena cava is normal in size with greater than 50%  respiratory variability, suggesting right atrial pressure of 3 mmHg.   Myoview 07/12/2019 The left ventricular ejection fraction is moderately decreased (30-44%). Nuclear stress EF: 40%. There was no ST segment deviation noted during stress. Defect 1: There is a medium defect present in the apical anterior, apical septal and apex location consistent with LBBB artifact and unchanged from prior. This is an  intermediate risk study due to reduced systolic function. There is no evidence of ischemia.  Echo 12/16/20: IMPRESSIONS     1. Left ventricular ejection fraction, by estimation, is 35 to 40%. The  left ventricle has moderately decreased function. The left ventricle has  no regional wall motion abnormalities. Left ventricular diastolic  parameters are consistent with Grade I  diastolic dysfunction (impaired relaxation).   2. Right ventricular systolic function is normal. The right ventricular  size is normal. There is normal pulmonary artery systolic pressure. The  estimated right ventricular systolic pressure is 13.0 mmHg.   3. The mitral valve is normal in structure. Mild mitral valve  regurgitation. No evidence of mitral stenosis.   4. The aortic valve is normal in structure. Aortic valve regurgitation is  not visualized. No aortic stenosis is present.   5. The inferior vena cava is normal in size with greater than 50%  respiratory variability, suggesting right atrial pressure of 3 mmHg.   Comparison(s): No significant change from prior study. Prior images  reviewed side  by side.   Event monitor 07/09/21: Study Highlights    Normal sinus rhythm Occasional PACs. one 6 beat run of SVT Frequent PVCs with burden 12.8%. some bigeminy and trigeminy     Patch Wear Time:  14 days and 0 hours (2023-04-22T17:39:14-0400 to 2023-05-06T17:39:18-0400)   Patient had a min HR of 50 bpm, max HR of 138 bpm, and avg HR of 68 bpm. Predominant underlying rhythm was Sinus Rhythm. Bundle Branch Block/IVCD was present. QRS morphology changes were present throughout recording. 1 run of Supraventricular Tachycardia  occurred lasting 6 beats with a max rate of 138 bpm (avg 125 bpm). Isolated SVEs were occasional (1.4%, 19579), SVE Couplets were rare (<1.0%, 1377), and SVE Triplets were rare (<1.0%, 26). Isolated VEs were frequent (12.8%, [id number]), VE Couplets were  rare (<1.0%, 6592), and VE Triplets were rare (<1.0%, 916). Ventricular Bigeminy and Trigeminy were present.         Recent Labs: No results found for requested labs within last 365 days.  Recent Lipid Panel No results found for: "CHOL", "TRIG", "HDL", "CHOLHDL", "VLDL", "LDLCALC", "LDLDIRECT"   Dated 05/08/19: cholesterol 150, triglycerides 202, HDL 42, LDL 48.,  Dated 08/12/19: A1c 8%. Creatinine 1.11. otherwise CMET normal.  Dated 12/30/19: cholesterol 115, triglycerides 155, HDL 42, LDL 46. Creatinine 1.16. otherwise CMET normal Dated 03/31/20: A1c 7.1%.  Dated 07/01/20: A1c 7.5%. cholesterol  117, triglycerides 151, HDL 43, LDL 48. Creatinine 1.23. otherwise CMET normal.  Dated 01/11/21: cholesterol 111, triglycerides 174, HDL 41, LDL 42 Dated 05/10/21: A1c 8%. Creatinine 1.23. otherwise CMET normal. Dated 08/30/21: A1c 8.6%.  Dated 10/05/21: creatinine 2.7. BUN 37. Hgb 11.1. otherwise CMET, CBC, Magnesium normal. Dated 11/02/21: normal CBC and chemistries Dated 12/01/21: A1c 5.6%.  Dated 04/04/22: cholesterol 109, HDL 37, LDL 45 Dated 06/06/22: triglycerides 117  Physical Exam:    VS:  BP 138/60   Pulse 62   Ht 5\' 4"   (1.626 m)   Wt 152 lb (68.9 kg)   SpO2 99%   BMI 26.09 kg/m     Wt Readings from Last 3 Encounters:  02/01/23 152 lb (68.9 kg)  08/29/22 146 lb (66.2 kg)  11/12/21 142 lb (64.4 kg)     GEN:  Well nourished, well developed in no acute distress HEENT: Normal NECK: No JVD; No carotid bruits LYMPHATICS: No lymphadenopathy CARDIAC: RRR, no murmurs, rubs, gallops RESPIRATORY:  Clear to auscultation without rales, wheezing or rhonchi  ABDOMEN: Soft,  non-tender, non-distended MUSCULOSKELETAL:  No edema; No deformity  SKIN: Warm and dry NEUROLOGIC:  Alert and oriented x 3 PSYCHIATRIC:  Normal affect   ASSESSMENT:    1. Coronary artery disease involving native coronary artery of native heart without angina pectoris   2. Chronic systolic CHF (congestive heart failure) (HCC)         PLAN:    In order of problems listed above:  1.   Chronic systolic CHF EF 35-40%. Class 2 symptoms. Unable to tolerate ARB or aldactone due to renal effects. Will continue Toprol. She is on Jardiance at 10 mg daily.  She is  Euvolemic. I suspect that  dyssynchrony with LBBB is playing a role in this. I would like to update Echo at this time. If EF lower would consider her for resynchronization therapy.  2. CAD: Class 1 angina. Continue ASA 81 mg daily and Crestor 10 mg daily.  3. Hypertension: currently well controlled.   4. Hyperlipidemia: on Crestor. Request copy of recent lab work  5. DM2: Managed by Dr Talmage Nap. On insulin and Jardiance.   6. Left bundle branch block: Chronic  7. 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 Asymptomatic currently. Continue Metoprolol    Medication Adjustments/Labs and Tests Ordered: Current medicines are reviewed at length with the patient today.  Concerns regarding medicines are outlined above.  Orders Placed This Encounter  Procedures   ECHOCARDIOGRAM COMPLETE    No orders of the defined types were placed in this encounter.    Patient Instructions  Medication  Instructions:  Continue same medications *If you need a refill on your cardiac medications before your next appointment, please call your pharmacy*   Lab Work: None ordered   Testing/Procedures: Schedule Echo next available    Follow-Up: At [facility], you and your health needs are our priority.  As part of our continuing mission to provide you with exceptional heart care, we have created designated Provider Care Teams.  These Care Teams include your primary Cardiologist (physician) and Advanced Practice Providers (APPs -  Physician Assistants and Nurse Practitioners) who all work together to provide you with the care you need, when you need it.  We recommend signing up for the patient portal called "MyChart".  Sign up information is provided on this After Visit Summary.  MyChart is used to connect with patients for Virtual Visits (Telemedicine).  Patients are able to view lab/test results, encounter notes, upcoming appointments, etc.  Non-urgent messages can be sent to your provider as well.   To learn more about what you can do with MyChart, go to [email or web address].    Your next appointment:  6 months   Call in March to schedule June appointment     Provider:  Dr.Riva Sesma    Follow up with me in 6 months  Signed, Jerrine Urschel [place], MD  02/01/2023 12:02 PM    [facility]

## 2023-02-01 ENCOUNTER — Encounter: Payer: Self-pay | Admitting: Cardiology

## 2023-02-01 ENCOUNTER — Ambulatory Visit: Payer: Medicare Other | Attending: Cardiology | Admitting: Cardiology

## 2023-02-01 VITALS — BP 138/60 | HR 62 | Ht 64.0 in | Wt 152.0 lb

## 2023-02-01 DIAGNOSIS — I447 Left bundle-branch block, unspecified: Secondary | ICD-10-CM

## 2023-02-01 DIAGNOSIS — E785 Hyperlipidemia, unspecified: Secondary | ICD-10-CM | POA: Diagnosis not present

## 2023-02-01 DIAGNOSIS — I5022 Chronic systolic (congestive) heart failure: Secondary | ICD-10-CM

## 2023-02-01 DIAGNOSIS — I2510 Atherosclerotic heart disease of native coronary artery without angina pectoris: Secondary | ICD-10-CM | POA: Diagnosis not present

## 2023-02-01 MED ORDER — EMPAGLIFLOZIN 10 MG PO TABS: 10.0000 mg | ORAL_TABLET | Freq: Every day | ORAL | 0 refills | Status: DC

## 2023-02-01 NOTE — Addendum Note (Signed)
Addended by: Neoma Laming on: 02/01/2023 01:26 PM   Modules accepted: Orders

## 2023-02-01 NOTE — Patient Instructions (Signed)
Medication Instructions:  Continue same medications *If you need a refill on your cardiac medications before your next appointment, please call your pharmacy*   Lab Work: None ordered   Testing/Procedures: Schedule Echo next available    Follow-Up: At [facility], you and your health needs are our priority.  As part of our continuing mission to provide you with exceptional heart care, we have created designated Provider Care Teams.  These Care Teams include your primary Cardiologist (physician) and Advanced Practice Providers (APPs -  Physician Assistants and Nurse Practitioners) who all work together to provide you with the care you need, when you need it.  We recommend signing up for the patient portal called "MyChart".  Sign up information is provided on this After Visit Summary.  MyChart is used to connect with patients for Virtual Visits (Telemedicine).  Patients are able to view lab/test results, encounter notes, upcoming appointments, etc.  Non-urgent messages can be sent to your provider as well.   To learn more about what you can do with MyChart, go to [email or web address].    Your next appointment:  6 months   Call in March to schedule June appointment     Provider:  Dr.Jordan

## 2023-02-03 ENCOUNTER — Ambulatory Visit: Payer: Medicare Other | Admitting: Cardiology

## 2023-02-03 IMAGING — MG MM DIGITAL SCREENING BILAT W/ TOMO AND CAD
8 series · 8 of 24 positions shown · non-contrast
Comparison: Previous exam(s).

CLINICAL DATA: Screening.

EXAM:
DIGITAL SCREENING BILATERAL MAMMOGRAM WITH TOMOSYNTHESIS AND CAD
TECHNIQUE: Bilateral screening digital craniocaudal and mediolateral oblique
mammograms were obtained. Bilateral screening digital breast
tomosynthesis was performed. The images were evaluated with
computer-aided detection.

[R MLO synth-2D]
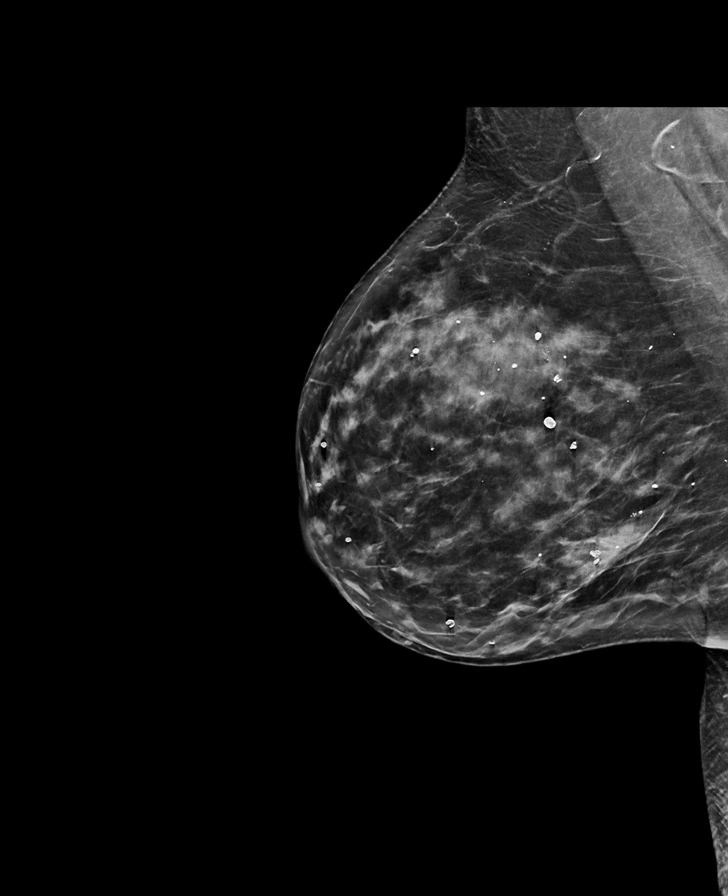

[L CC synth-2D]
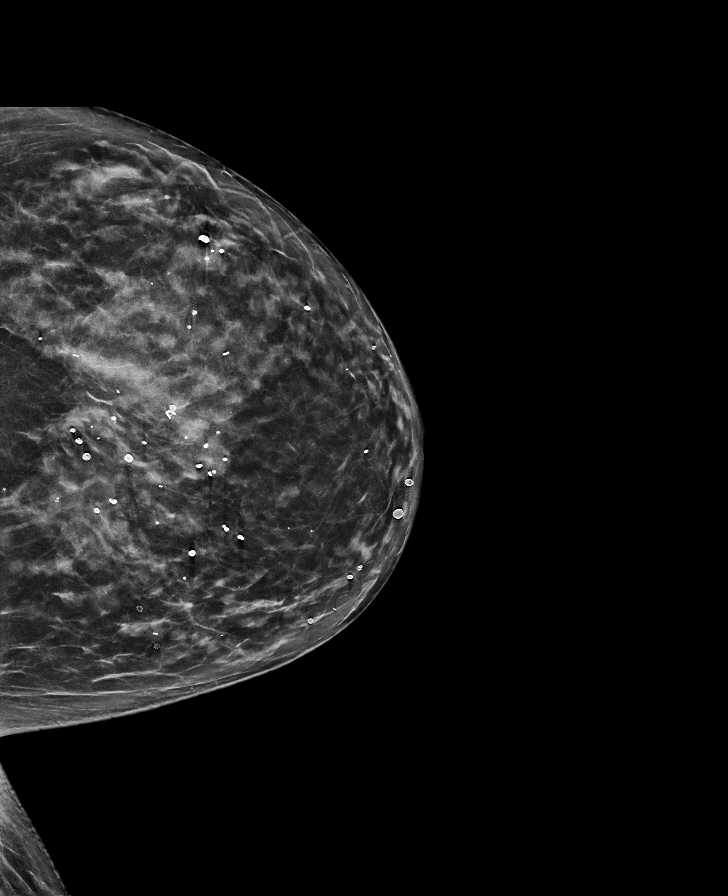

[L MLO synth-2D]
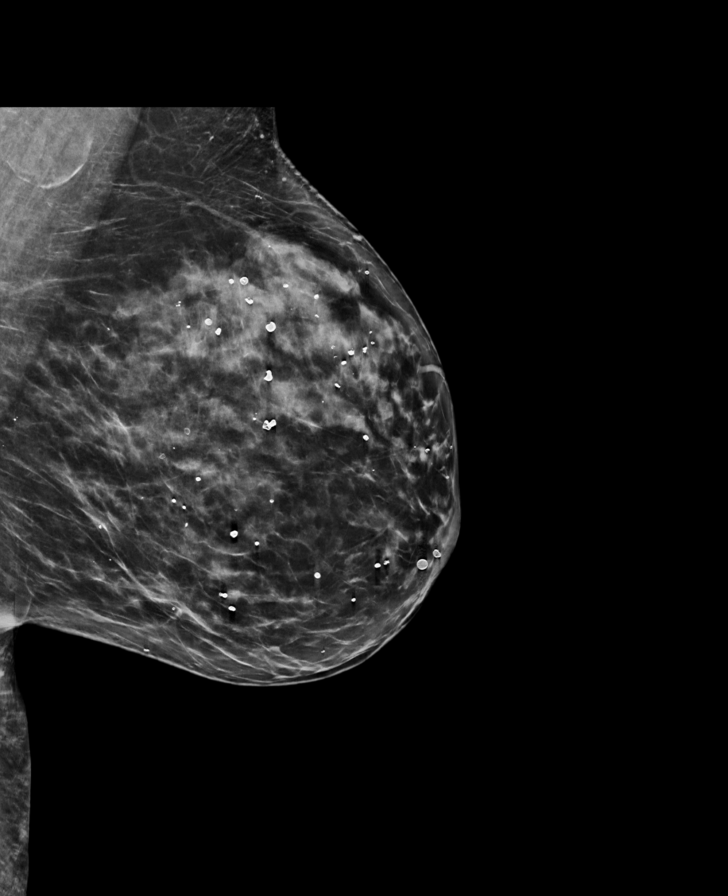

[R CC synth-2D]
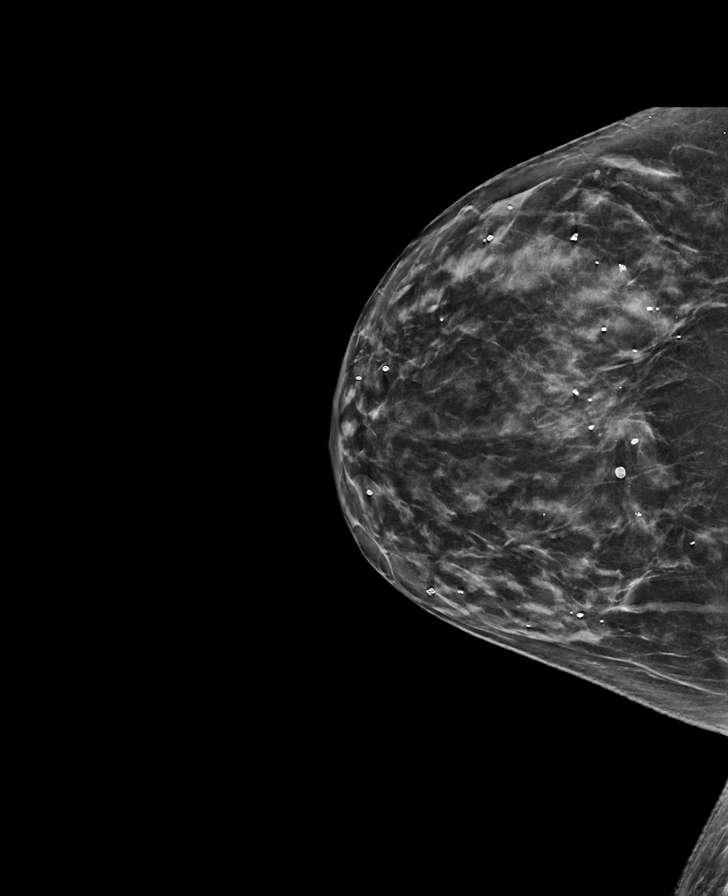

[R CC tomo · tomo slice 35/68.0]
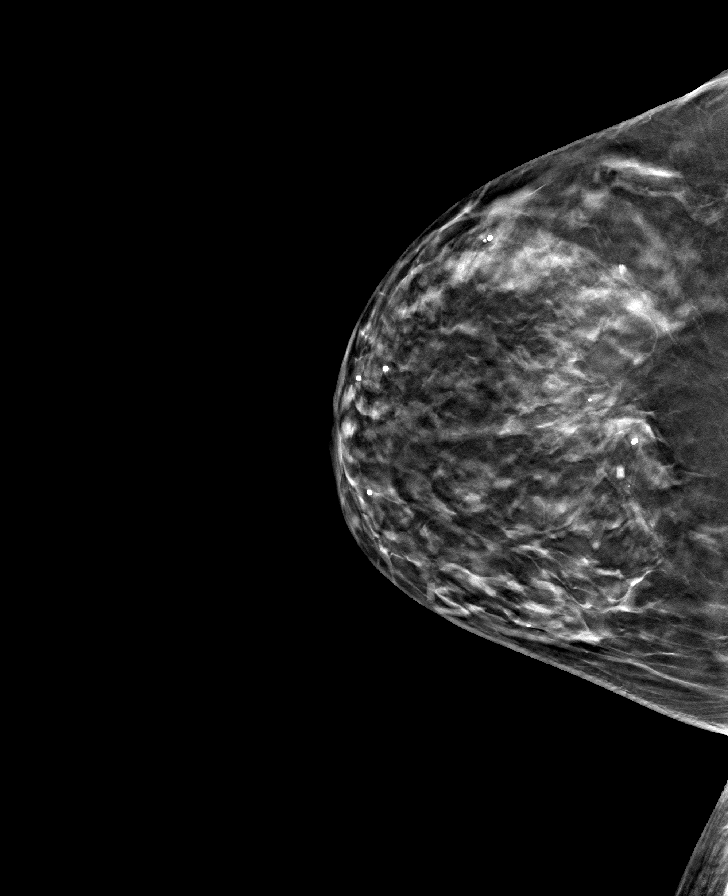

[L MLO tomo · tomo slice 41/80.0]
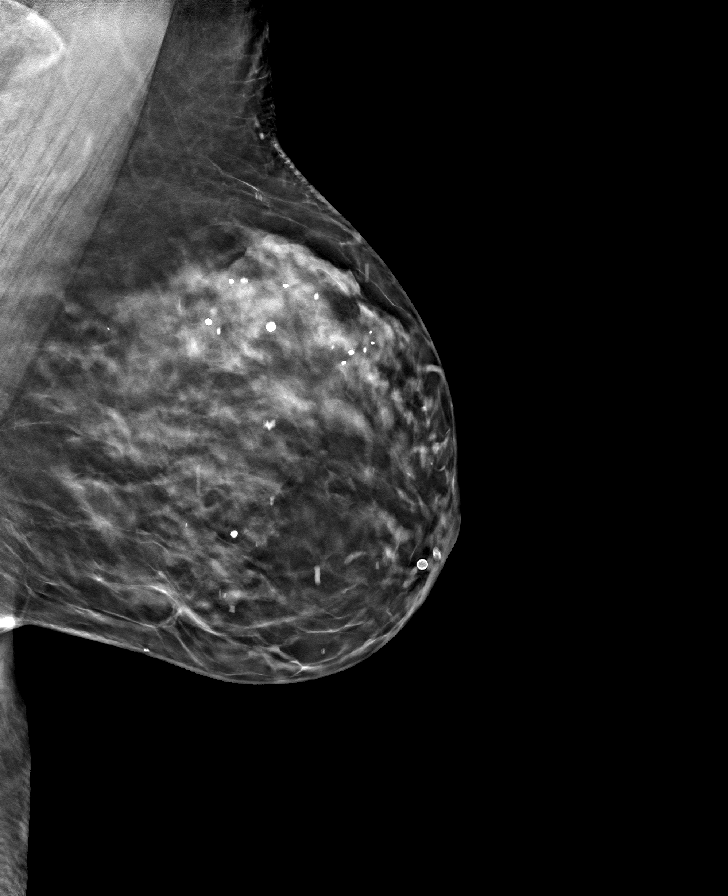

[L CC tomo · tomo slice 35/68.0]
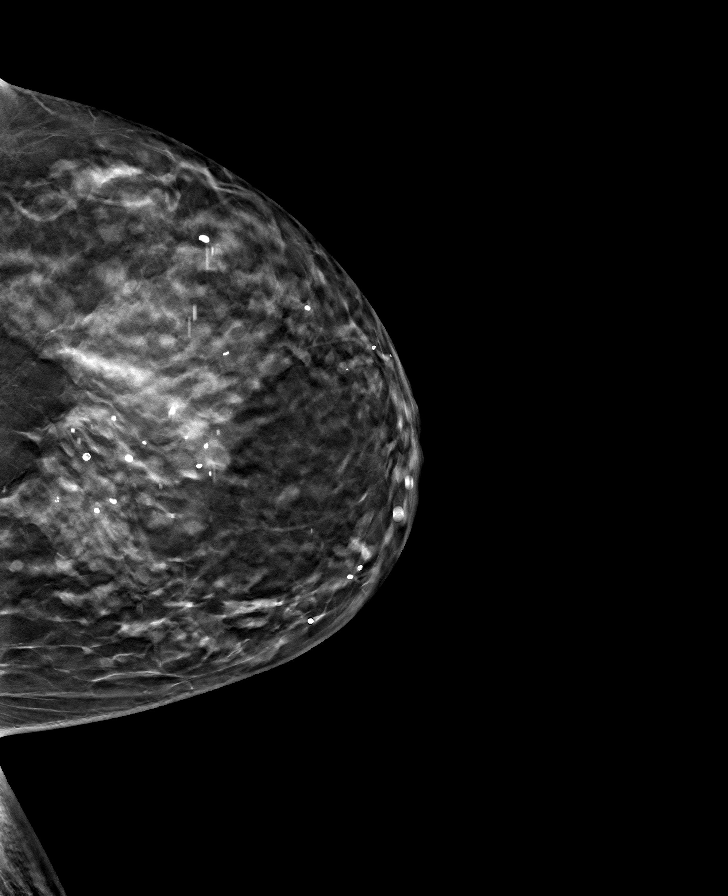

[R MLO tomo · tomo slice 39/77.0]
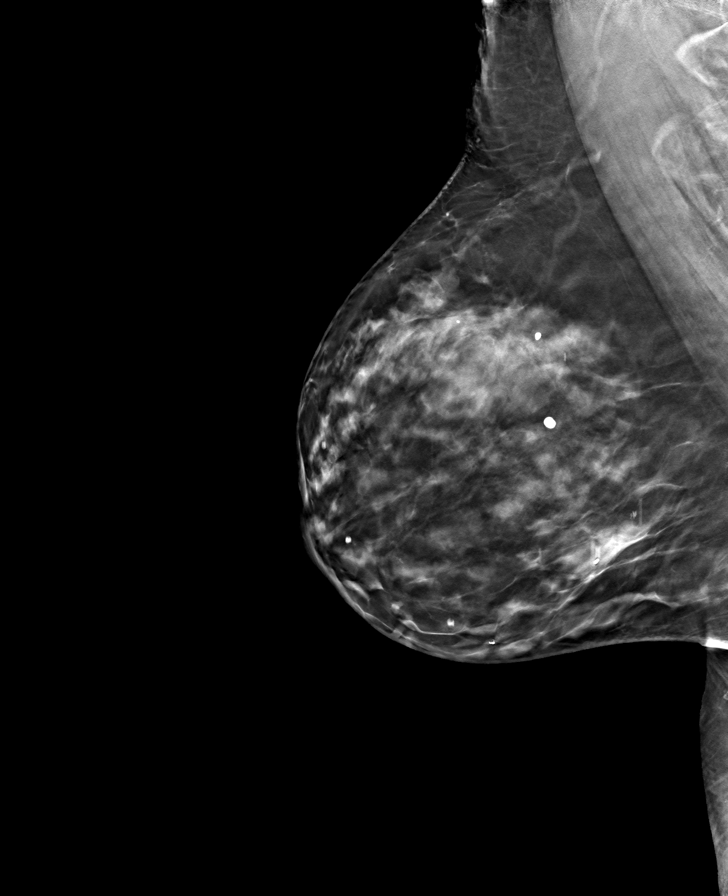

[8 of 24 positions shown; findings below may reference images not displayed]

ACR Breast Density Category c: The breast tissue is heterogeneously
dense, which may obscure small masses.
FINDINGS: There are no findings suspicious for malignancy.
IMPRESSION: No mammographic evidence of malignancy. A result letter of this
screening mammogram will be mailed directly to the patient.

RECOMMENDATION:
Screening mammogram in one year. (Code:Q3-W-BC3)

BI-RADS CATEGORY  1: Negative.

## 2023-03-09 ENCOUNTER — Other Ambulatory Visit: Payer: Self-pay

## 2023-03-09 ENCOUNTER — Ambulatory Visit (HOSPITAL_COMMUNITY)
Admission: RE | Admit: 2023-03-09 | Discharge: 2023-03-09 | Disposition: A | Discharge status: Home / Self Care | Payer: Medicare Other | Source: Ambulatory Visit | Attending: Cardiology | Admitting: Cardiology

## 2023-03-09 DIAGNOSIS — I2510 Atherosclerotic heart disease of native coronary artery without angina pectoris: Secondary | ICD-10-CM | POA: Diagnosis not present

## 2023-03-09 DIAGNOSIS — R931 Abnormal findings on diagnostic imaging of heart and coronary circulation: Secondary | ICD-10-CM

## 2023-03-09 DIAGNOSIS — I5022 Chronic systolic (congestive) heart failure: Secondary | ICD-10-CM

## 2023-03-09 DIAGNOSIS — I447 Left bundle-branch block, unspecified: Secondary | ICD-10-CM

## 2023-03-09 LAB — ECHOCARDIOGRAM COMPLETE
Area-P 1/2: 3.39 cm2
S' Lateral: 3.4 cm

## 2023-03-09 MED ORDER — PERFLUTREN LIPID MICROSPHERE
2.0000 mL | INTRAVENOUS | Status: AC | PRN
Start: 2023-03-09 — End: 2023-03-09
  Administered 2023-03-09: 2 mL via INTRAVENOUS

## 2023-03-21 ENCOUNTER — Encounter: Payer: Self-pay | Admitting: Cardiology

## 2023-03-21 ENCOUNTER — Ambulatory Visit: Payer: Medicare Other | Attending: Cardiology | Admitting: Cardiology

## 2023-03-21 VITALS — BP 122/58 | HR 69 | Ht 64.0 in | Wt 150.8 lb

## 2023-03-21 DIAGNOSIS — I5022 Chronic systolic (congestive) heart failure: Secondary | ICD-10-CM | POA: Diagnosis not present

## 2023-03-21 DIAGNOSIS — I493 Ventricular premature depolarization: Secondary | ICD-10-CM | POA: Diagnosis not present

## 2023-03-21 DIAGNOSIS — I447 Left bundle-branch block, unspecified: Secondary | ICD-10-CM | POA: Diagnosis not present

## 2023-03-21 NOTE — Progress Notes (Signed)
Electrophysiology Office Note:   Date:  03/21/2023  ID:  ELDANA LEBOWITZ, DOB 1950-04-20, MRN [id number]  Primary Cardiologist: Peter [place], MD Primary Heart Failure: None Electrophysiologist: None      History of Present Illness:   Molly Carpenter is a 73 y.o. female with h/o coronary disease, hypertension, hyperlipidemia, diabetes, left bundle branch block seen today for  for Electrophysiology evaluation of CHF at the request of Peter [place].    She had a cardiac catheterization in 2002 which showed moderate disease and a septal perforator.  Myoview in 2012 showed a fixed anteroapical and septal defect likely related to left bundle branch block.  Echo in 2021 showed an ejection fraction of 40 to 45%.  She was having shortness of breath at the time.  Myoview showed no evidence of ischemia.  Repeat echo in 2022 showed an ejection fraction of 35 to 40%.  Repeat echo this year shows an ejection fraction of 30 to 35%.  Patient feels well.  She does have shortness of breath when she exerts herself, but is able to do all of her daily activities.  She states that she likes to be quite active.  Review of systems complete and found to be negative unless listed in HPI.   EP Information / Studies Reviewed:    EKG is ordered today. Personal review as below.  EKG Interpretation Date/Time:  Tuesday March 21 2023 15:34:12 EST Ventricular Rate:  69 PR Interval:  184 QRS Duration:  158 QT Interval:  456 QTC Calculation: 488 R Axis:   127  Text Interpretation: Sinus rhythm with occasional Premature ventricular complexes Non-specific intra-ventricular conduction block Minimal voltage criteria for LVH, may be normal variant ( Cornell product ) Possible Lateral infarct , age undetermined When compared with ECG of 29-Aug-2022 08:01, Confirmed by [employer], Cartier Mapel ([id number]) on 03/21/2023 3:43:33 PM     Risk Assessment/Calculations:              Physical Exam:   VS:  BP (!) 122/58 (BP Location:  Left Arm, Patient Position: Sitting, Cuff Size: Normal)   Pulse 69   Ht 5\' 4"  (1.626 m)   Wt 150 lb 12.8 oz (68.4 kg)   SpO2 98%   BMI 25.88 kg/m    Wt Readings from Last 3 Encounters:  03/21/23 150 lb 12.8 oz (68.4 kg)  02/01/23 152 lb (68.9 kg)  08/29/22 146 lb (66.2 kg)     GEN: Well nourished, well developed in no acute distress NECK: No JVD; No carotid bruits CARDIAC: Regular rate and rhythm, no murmurs, rubs, gallops RESPIRATORY:  Clear to auscultation without rales, wheezing or rhonchi  ABDOMEN: Soft, non-tender, non-distended EXTREMITIES:  No edema; No deformity   ASSESSMENT AND PLAN:    1.  Chronic systolic heart failure: Ejection fraction 30-35 %.  Has left bundle branch block.  Nonischemic.  Currently on medical therapy per primary cardiology, unable to tolerate ARB or Aldactone due to renal effects.  She has left bundle branch block and would potentially benefit from CRT pacing.  She would like to potentially avoid ICD if possible.  Ivaan Liddy discuss this further with her primary cardiologist as she has minimal symptoms, and if he feels that is necessary, we Jahari Billy plan for implant.  2.  Coronary artery disease: Continue aspirin per primary cardiology  3.  Hypertension: Currently well-controlled  4.  PVCs: Monitor with 12% burden.  Continue metoprolol  Follow up with Dr. Elberta Fortis  pending decision on device   Signed, Jacon Whetzel  Jorja Loa, MD

## 2023-03-21 NOTE — Patient Instructions (Signed)
Medication Instructions:  Your physician recommends that you continue on your current medications as directed. Please refer to the Current Medication list given to you today.  *If you need a refill on your cardiac medications before your next appointment, please call your pharmacy*   Lab Work: None ordered.  If you have labs (blood work) drawn today and your tests are completely normal, you will receive your results only by: MyChart Message (if you have MyChart) OR A paper copy in the mail If you have any lab test that is abnormal or we need to change your treatment, we will call you to review the results.   Testing/Procedures: None ordered.    Follow-Up: At [facility], you and your health needs are our priority.  As part of our continuing mission to provide you with exceptional heart care, we have created designated Provider Care Teams.  These Care Teams include your primary Cardiologist (physician) and Advanced Practice Providers (APPs -  Physician Assistants and Nurse Practitioners) who all work together to provide you with the care you need, when you need it.  We recommend signing up for the patient portal called "MyChart".  Sign up information is provided on this After Visit Summary.  MyChart is used to connect with patients for Virtual Visits (Telemedicine).  Patients are able to view lab/test results, encounter notes, upcoming appointments, etc.  Non-urgent messages can be sent to your provider as well.   To learn more about what you can do with MyChart, go to [email or web address].    Your next appointment:   To be determined by Dr Elberta Fortis

## 2023-03-22 ENCOUNTER — Telehealth: Payer: Self-pay | Admitting: Cardiology

## 2023-03-22 NOTE — Telephone Encounter (Signed)
Spoke with pt who is asking if Dr Elberta Fortis has further discussed pacemaker with Dr [place].  Pt advised at this time Dr Elberta Fortis has not provided an update but pt's information has been provided to Dr Elberta Fortis RN, Sherri.  Rn will contact pt once she has further information from Dr Elberta Fortis.  Pt verbalizes understanding and thanked Charity fundraiser for the call.

## 2023-03-22 NOTE — Telephone Encounter (Signed)
Patient called to talk with Dr. Elberta Fortis or nurse about procedure of pacemaker

## 2023-03-24 NOTE — Telephone Encounter (Signed)
Pt informed that BiV device would be the most effective way to treat this. Pt asking about mowing and using a battery operated chainsaw (small one, to clear branches).  Aware I will discuss this w/ MD and device manufacturer. She understands I will follow up in the next several weeks about this.  States she would want to do procedure possibly week of 3/10 but will decide when I call her back. Pt agreeable to above plan.

## 2023-03-30 DIAGNOSIS — E11319 Type 2 diabetes mellitus with unspecified diabetic retinopathy without macular edema: Secondary | ICD-10-CM | POA: Diagnosis not present

## 2023-04-07 ENCOUNTER — Other Ambulatory Visit: Payer: Self-pay

## 2023-04-07 ENCOUNTER — Telehealth: Payer: Self-pay

## 2023-04-07 DIAGNOSIS — I5022 Chronic systolic (congestive) heart failure: Secondary | ICD-10-CM

## 2023-04-07 DIAGNOSIS — Z0000 Encounter for general adult medical examination without abnormal findings: Secondary | ICD-10-CM | POA: Diagnosis not present

## 2023-04-07 DIAGNOSIS — E1122 Type 2 diabetes mellitus with diabetic chronic kidney disease: Secondary | ICD-10-CM | POA: Diagnosis not present

## 2023-04-07 DIAGNOSIS — I447 Left bundle-branch block, unspecified: Secondary | ICD-10-CM

## 2023-04-07 NOTE — Telephone Encounter (Signed)
 Pt is aware that per our device clinic if she uses a chain saw/limb trimmer it will need to be atleast 12" away from her battery/device.

## 2023-04-07 NOTE — Telephone Encounter (Signed)
 Pt scheduled for BiV-PPM Implant with Dr. Inocencio on 2/28 at 3:00 pm. She will go to [facility] for updated labs.   Instruction letter will be mailed to pt per her request.   She has an upcoming appt with her Endocrinologist and will call us  with any medication changes so we can adjust her instructions as needed.

## 2023-04-17 DIAGNOSIS — I5022 Chronic systolic (congestive) heart failure: Secondary | ICD-10-CM | POA: Diagnosis not present

## 2023-04-17 DIAGNOSIS — I447 Left bundle-branch block, unspecified: Secondary | ICD-10-CM | POA: Diagnosis not present

## 2023-04-17 LAB — CBC

## 2023-04-18 DIAGNOSIS — E782 Mixed hyperlipidemia: Secondary | ICD-10-CM | POA: Diagnosis not present

## 2023-04-18 DIAGNOSIS — E1122 Type 2 diabetes mellitus with diabetic chronic kidney disease: Secondary | ICD-10-CM | POA: Diagnosis not present

## 2023-04-18 DIAGNOSIS — I10 Essential (primary) hypertension: Secondary | ICD-10-CM | POA: Diagnosis not present

## 2023-04-18 DIAGNOSIS — N1832 Chronic kidney disease, stage 3b: Secondary | ICD-10-CM | POA: Diagnosis not present

## 2023-04-18 DIAGNOSIS — I509 Heart failure, unspecified: Secondary | ICD-10-CM | POA: Diagnosis not present

## 2023-04-18 LAB — CBC
Hematocrit: 43.2 % (ref 34.0–46.6)
Hemoglobin: 13.7 g/dL (ref 11.1–15.9)
MCH: 26.6 pg (ref 26.6–33.0)
MCHC: 31.7 g/dL (ref 31.5–35.7)
MCV: 84 fL (ref 79–97)
Platelets: 229 10*3/uL (ref 150–450)
RBC: 5.15 x10E6/uL (ref 3.77–5.28)
RDW: 15.7 % — ABNORMAL HIGH (ref 11.7–15.4)
WBC: 7.5 10*3/uL (ref 3.4–10.8)

## 2023-04-18 LAB — BASIC METABOLIC PANEL
BUN/Creatinine Ratio: 19 (ref 12–28)
BUN: 27 mg/dL (ref 8–27)
CO2: 21 mmol/L (ref 20–29)
Calcium: 9.6 mg/dL (ref 8.7–10.3)
Chloride: 106 mmol/L (ref 96–106)
Creatinine, Ser: 1.43 mg/dL — ABNORMAL HIGH (ref 0.57–1.00)
Glucose: 126 mg/dL — ABNORMAL HIGH (ref 70–99)
Potassium: 4.2 mmol/L (ref 3.5–5.2)
Sodium: 145 mmol/L — ABNORMAL HIGH (ref 134–144)
eGFR: 39 mL/min/{1.73_m2} — ABNORMAL LOW (ref >59)

## 2023-04-19 ENCOUNTER — Telehealth: Payer: Self-pay

## 2023-04-19 NOTE — Telephone Encounter (Signed)
 Pt had left me a message stating that her medication had changed and she wanted to notify me of the change.   I returned her call and her Humalog was changed to Novolog mix 70/30 12u/TID before meals. I looked over the protocol and the instructions are the same for both medications.

## 2023-04-27 NOTE — Pre-Procedure Instructions (Signed)
 Instructed patient on the following items: Arrival time 1230 May have light breakfast before 7am, nothing after 7am No meds AM of procedure Responsible person to drive you home and stay with you for 24 hrs Wash with special soap night before and morning of procedure

## 2023-04-28 ENCOUNTER — Encounter (HOSPITAL_COMMUNITY)
Admission: RE | Disposition: A | Discharge status: Home / Self Care | Payer: Self-pay | Source: Home / Self Care | Attending: Cardiology

## 2023-04-28 ENCOUNTER — Other Ambulatory Visit: Payer: Self-pay

## 2023-04-28 ENCOUNTER — Ambulatory Visit (HOSPITAL_COMMUNITY)
Admission: RE | Admit: 2023-04-28 | Discharge: 2023-04-28 | Disposition: A | Discharge status: Home / Self Care | Payer: Medicare Other | Attending: Cardiology | Admitting: Cardiology

## 2023-04-28 ENCOUNTER — Ambulatory Visit (HOSPITAL_COMMUNITY): Payer: Medicare Other

## 2023-04-28 DIAGNOSIS — I428 Other cardiomyopathies: Secondary | ICD-10-CM | POA: Insufficient documentation

## 2023-04-28 DIAGNOSIS — I447 Left bundle-branch block, unspecified: Secondary | ICD-10-CM | POA: Diagnosis not present

## 2023-04-28 DIAGNOSIS — I110 Hypertensive heart disease with heart failure: Secondary | ICD-10-CM | POA: Diagnosis not present

## 2023-04-28 DIAGNOSIS — I5022 Chronic systolic (congestive) heart failure: Secondary | ICD-10-CM | POA: Diagnosis not present

## 2023-04-28 DIAGNOSIS — R0989 Other specified symptoms and signs involving the circulatory and respiratory systems: Secondary | ICD-10-CM | POA: Diagnosis not present

## 2023-04-28 HISTORY — PX: BIV PACEMAKER INSERTION CRT-P: EP1199

## 2023-04-28 LAB — GLUCOSE, CAPILLARY: Glucose-Capillary: 119 mg/dL — ABNORMAL HIGH (ref 70–99)

## 2023-04-28 SURGERY — BIV PACEMAKER INSERTION CRT-P
Anesthesia: LOCAL

## 2023-04-28 MED ORDER — IOHEXOL 350 MG/ML SOLN
INTRAVENOUS | Status: DC | PRN
  Administered 2023-04-28 (×2): 5 mL

## 2023-04-28 MED ORDER — FENTANYL CITRATE (PF) 100 MCG/2ML IJ SOLN
INTRAMUSCULAR | Status: AC
  Filled 2023-04-28: qty 2

## 2023-04-28 MED ORDER — LIDOCAINE HCL 1 % IJ SOLN
INTRAMUSCULAR | Status: AC
  Filled 2023-04-28: qty 60

## 2023-04-28 MED ORDER — MIDAZOLAM HCL 5 MG/5ML IJ SOLN
INTRAMUSCULAR | Status: AC
Start: 2023-04-28 — End: ?
  Filled 2023-04-28: qty 5

## 2023-04-28 MED ORDER — POVIDONE-IODINE 10 % EX SWAB
2.0000 | Freq: Once | CUTANEOUS | Status: AC
  Administered 2023-04-28: 2 via TOPICAL

## 2023-04-28 MED ORDER — ACETAMINOPHEN 325 MG PO TABS: 325.0000 mg | ORAL_TABLET | ORAL | Status: DC | PRN

## 2023-04-28 MED ORDER — HEPARIN (PORCINE) IN NACL 1000-0.9 UT/500ML-% IV SOLN
INTRAVENOUS | Status: DC | PRN
  Administered 2023-04-28: 500 mL

## 2023-04-28 MED ORDER — CHLORHEXIDINE GLUCONATE 4 % EX SOLN: 4.0000 | Freq: Once | CUTANEOUS | Status: DC

## 2023-04-28 MED ORDER — ONDANSETRON HCL 4 MG/2ML IJ SOLN: 4.0000 mg | Freq: Four times a day (QID) | INTRAMUSCULAR | Status: DC | PRN

## 2023-04-28 MED ORDER — CEFAZOLIN SODIUM-DEXTROSE 2-4 GM/100ML-% IV SOLN: 2.0000 g | INTRAVENOUS | Status: AC

## 2023-04-28 MED ORDER — SODIUM CHLORIDE 0.9 % IV SOLN
INTRAVENOUS | Status: AC
  Administered 2023-04-28: 80 mg
  Filled 2023-04-28: qty 2

## 2023-04-28 MED ORDER — CEFAZOLIN SODIUM-DEXTROSE 2-4 GM/100ML-% IV SOLN
INTRAVENOUS | Status: AC
  Administered 2023-04-28: 2 g via INTRAVENOUS
  Filled 2023-04-28: qty 100

## 2023-04-28 MED ORDER — LIDOCAINE HCL (PF) 1 % IJ SOLN
INTRAMUSCULAR | Status: DC | PRN
  Administered 2023-04-28: 50 mL

## 2023-04-28 MED ORDER — FENTANYL CITRATE (PF) 100 MCG/2ML IJ SOLN
INTRAMUSCULAR | Status: DC | PRN
  Administered 2023-04-28 (×4): 25 ug via INTRAVENOUS

## 2023-04-28 MED ORDER — SODIUM CHLORIDE 0.9 % IV SOLN: 80.0000 mg | INTRAVENOUS | Status: AC

## 2023-04-28 MED ORDER — SODIUM CHLORIDE 0.9 % IV SOLN: INTRAVENOUS | Status: DC

## 2023-04-28 MED ORDER — MIDAZOLAM HCL 5 MG/5ML IJ SOLN
INTRAMUSCULAR | Status: DC | PRN
  Administered 2023-04-28 (×4): 1 mg via INTRAVENOUS

## 2023-04-28 SURGICAL SUPPLY — 24 items
BALLN COR SINUS VENO 6FR 80 (BALLOONS) ×1 IMPLANT
BALLOON COR SINUS VENO 6FR 80 (BALLOONS) IMPLANT
CABLE SURGICAL S-101-97-12 (CABLE) ×1 IMPLANT
CATH ATTAIN SEL SURV 6248V-90 (CATHETERS) IMPLANT
CATH CPS DIRECT 135 DS2C020 (CATHETERS) IMPLANT
CATH CPS LOCATOR 3D MED (CATHETERS) IMPLANT
HELIX LOCKING TOOL (MISCELLANEOUS) ×1 IMPLANT
LEAD QUARTET 1456Q-86 (Lead) IMPLANT
LEAD QUICKFLEX 1258T-86 (Lead) IMPLANT
LEAD ULTIPACE 52 LPA1231/52 (Lead) IMPLANT
LEAD ULTIPACE 65 LPA1231/65 (Lead) IMPLANT
PACEMAKER ASSURITY DR-RF (Pacemaker) IMPLANT
PAD DEFIB RADIO PHYSIO CONN (PAD) ×1 IMPLANT
QUARTET 1456Q-86 (Lead) ×1 IMPLANT
SHEATH 7FR PRELUDE SNAP 13 (SHEATH) IMPLANT
SHEATH 9.5FR PRELUDE SNAP 13 (SHEATH) IMPLANT
SHEATH 9FR PRELUDE SNAP 13 (SHEATH) IMPLANT
SHEATH PROBE COVER 6X72 (BAG) IMPLANT
SLITTER AGILIS HISPRO (INSTRUMENTS) IMPLANT
TOOL HELIX LOCKING (MISCELLANEOUS) IMPLANT
TRAY PACEMAKER INSERTION (PACKS) ×1 IMPLANT
WIRE ACUITY WHISPER EDS 4648 (WIRE) IMPLANT
WIRE HI TORQ VERSACORE-J 145CM (WIRE) IMPLANT
WIRE MAILMAN 182CM (WIRE) IMPLANT

## 2023-04-28 NOTE — Discharge Instructions (Addendum)
 After Your Pacemaker   You have a Abbott Pacemaker ([employer])  ACTIVITY Do not lift your arm above shoulder height for 1 week after your procedure. After 7 days, you may progress as below.  You should remove your sling 24 hours after your procedure, unless otherwise instructed by your provider.     Friday May 05, 2023  Saturday May 06, 2023 Sunday May 07, 2023 Monday May 08, 2023   Do not lift, push, pull, or carry anything over 10 pounds with the affected arm until 6 weeks (Friday June 09, 2023 ) after your procedure.   You may drive AFTER your wound check, unless you have been told otherwise by your provider.   Ask your healthcare provider when you can go back to work   INCISION/Dressing  If large square, outer bandage is left in place, this can be removed after 24 hours from your procedure. Do not remove steri-strips or glue as below.   Monitor your Pacemaker site for redness, swelling, and drainage. Call the device clinic at [phone] if you experience these symptoms or fever/chills.  If your incision is sealed with Steri-strips or staples, you may shower 7 days after your procedure or when told by your provider. Do not remove the steri-strips or let the shower hit directly on your site. You may wash around your site with soap and water.    If you were discharged in a sling, please do not wear this during the day more than 48 hours after your surgery unless otherwise instructed. This may increase the risk of stiffness and soreness in your shoulder.   Avoid lotions, ointments, or perfumes over your incision until it is well-healed.  You may use a hot tub or a pool AFTER your wound check appointment if the incision is completely closed.  Pacemaker Alerts:  Some alerts are vibratory and others beep. These are NOT emergencies. Please call our office to let us know. If this occurs at night or on weekends, it can wait until the next business day. Send a remote  transmission.  DEVICE MANAGEMENT Remote monitoring is used to monitor your pacemaker from home. This monitoring is scheduled every 91 days by our office. It allows [place] to keep an eye on the functioning of your device to ensure it is working properly. You will routinely see your Electrophysiologist annually (more often if necessary).   You should receive your ID card for your new device in 4-8 weeks. Keep this card with you at all times once received. Consider wearing a medical alert bracelet or necklace.  Your Pacemaker may be MRI compatible. This will be discussed at your next office visit/wound check.  You should avoid contact with strong electric or magnetic fields.   Do not use amateur (ham) radio equipment or electric (arc) welding torches. MP3 player headphones with magnets should not be used. Some devices are safe to use if held at least 12 inches (30 cm) from your Pacemaker. These include power tools, lawn mowers, and speakers. If you are unsure if something is safe to use, ask your health care provider.  When using your cell phone, hold it to the ear that is on the opposite side from the Pacemaker. Do not leave your cell phone in a pocket over the Pacemaker.  You may safely use electric blankets, heating pads, computers, and microwave ovens.  Call the office right away if: You have chest pain. You feel more short of breath than you have felt before. You  feel more light-headed than you have felt before. Your incision starts to open up.  This information is not intended to replace advice given to you by your health care provider. Make sure you discuss any questions you have with your health care provider.

## 2023-04-28 NOTE — H&P (Signed)
  Electrophysiology Office Note:   Date:  04/28/2023  ID:  Molly Carpenter, DOB May 08, 1950, MRN [id number]  Primary Cardiologist: Peter [place], MD Primary Heart Failure: None Electrophysiologist: None      History of Present Illness:   Molly Carpenter is a 73 y.o. female with h/o coronary disease, hypertension, hyperlipidemia, diabetes, left bundle branch block seen today for  for Electrophysiology evaluation of CHF at the request of Peter [place].    Today, denies symptoms of palpitations, chest pain, shortness of breath, orthopnea, PND, lower extremity edema, claudication, dizziness, presyncope, syncope, bleeding, or neurologic sequela. The patient is tolerating medications without difficulties. Plan for CRT-P implant today.   EP Information / Studies Reviewed:    EKG is ordered today. Personal review as below.        Risk Assessment/Calculations:             Physical Exam:   VS:  BP (!) 142/87   Pulse (!) 59   Temp 98.3 F (36.8 C) (Oral)   Resp 17   Ht 5' 3.5" (1.613 m)   Wt 67.6 kg   SpO2 97%   BMI 25.98 kg/m    Wt Readings from Last 3 Encounters:  04/28/23 67.6 kg  03/21/23 68.4 kg  02/01/23 68.9 kg    GEN: No acute distress.   Neck: No JVD Cardiac: RRR, no murmurs, rubs, or gallops.  Respiratory: decreased BS bases bilaterally. GI: Soft, nontender, non-distended  MS: No edema; No deformity. Neuro:  Nonfocal  Skin: warm and dry Psych: Normal affect    ASSESSMENT AND PLAN:    1.  Chronic systolic heart failure: Molly Carpenter has presented today for surgery, with the diagnosis of heart failure.  The various methods of treatment have been discussed with the patient and family. After consideration of risks, benefits and other options for treatment, the patient has consented to  Procedure(s): pacemaker implant as a surgical intervention .  Risks include but not limited to bleeding, infection, pneumothorax, perforation, tamponade, vascular damage,  renal failure, MI, stroke, death, and lead dislodgement . The patient's history has been reviewed, patient examined, no change in status, stable for surgery.  I have reviewed the patient's chart and labs.  Questions were answered to the patient's satisfaction.    Lanny Donoso Elberta Fortis, MD 04/28/2023 1:02 PM

## 2023-05-01 ENCOUNTER — Encounter (HOSPITAL_COMMUNITY): Payer: Self-pay | Admitting: Cardiology

## 2023-05-04 ENCOUNTER — Telehealth: Payer: Self-pay | Admitting: Cardiology

## 2023-05-04 ENCOUNTER — Ambulatory Visit: Attending: Cardiovascular Disease

## 2023-05-04 DIAGNOSIS — Z950 Presence of cardiac pacemaker: Secondary | ICD-10-CM

## 2023-05-04 NOTE — Telephone Encounter (Signed)
 Patient reports flat, red "prickly" type rash that seems to migrate from left of incision line across chest.  It is not hot, no signs of open, oozing or draining at or near incision site.  She thinks she is reacting to the cleanser used pre-op.   Patient sending a picture through mychart for [place] to evaluate.  She will call once it has been sent.

## 2023-05-04 NOTE — Telephone Encounter (Signed)
 Had pacemaker put in last Friday and has a rash across chest area. Rash seems to be moving. Will sting and itch sometimes off and on

## 2023-05-04 NOTE — Telephone Encounter (Signed)
 Patient came in to clinic - refer to clinic eval note.

## 2023-05-04 NOTE — Progress Notes (Signed)
 Patient presents with pin point prickly red rash mid chest around to left shoulder/upper breast area.  Likely related to sensitivity to the adhesive and cleansing solution used pre-op.  She states it is not that bothersome other than looking alarming to them.  Device site is healing WNL, no signs of infection.  She will take OTC benadryl as needed and can apply hydrocortisone cream if needed but is NOT to apply creams near the bandage/strip site.    She will call us if doesn't continue to heal or other concerning symptoms.  Has an appointment with device clinic in 1 week, will re-eval then.

## 2023-05-10 NOTE — Patient Instructions (Signed)
   After Your Pacemaker   Monitor your pacemaker site for redness, swelling, and drainage. Call the device clinic at [phone] if you experience these symptoms or fever/chills.  Your incision was closed with Steri-strips or staples:  You may shower 7 days after your procedure and wash your incision with soap and water. Avoid lotions, ointments, or perfumes over your incision until it is well-healed.  You may use a hot tub or a pool after your wound check appointment if the incision is completely closed.  Do not lift, push or pull greater than 10 pounds with the affected arm until 6 weeks after your procedure. UNTIL AFTER APRIL 11TH. There are no other restrictions in arm movement after your wound check appointment.  You may drive, unless driving has been restricted by your healthcare providers.  Remote monitoring is used to monitor your pacemaker from home. This monitoring is scheduled every 91 days by our office. It allows [place] to keep an eye on the functioning of your device to ensure it is working properly. You will routinely see your Electrophysiologist annually (more often if necessary).

## 2023-05-11 ENCOUNTER — Telehealth: Payer: Self-pay

## 2023-05-11 ENCOUNTER — Ambulatory Visit: Payer: Medicare Other | Attending: Cardiovascular Disease

## 2023-05-11 DIAGNOSIS — I255 Ischemic cardiomyopathy: Secondary | ICD-10-CM

## 2023-05-11 LAB — CUP PACEART INCLINIC DEVICE CHECK
Battery Remaining Longevity: 126 mo
Battery Voltage: 3.05 V
Brady Statistic RA Percent Paced: 26 %
Brady Statistic RV Percent Paced: 41 %
Date Time Interrogation Session: 20250313200303
Implantable Lead Connection Status: 753985
Implantable Lead Connection Status: 753985
Implantable Lead Implant Date: 20250228
Implantable Lead Implant Date: 20250228
Implantable Lead Location: 753859
Implantable Lead Location: 753860
Implantable Pulse Generator Implant Date: 20250228
Lead Channel Impedance Value: 487.5 Ohm
Lead Channel Impedance Value: 512.5 Ohm
Lead Channel Pacing Threshold Amplitude: 0.75 V
Lead Channel Pacing Threshold Amplitude: 0.75 V
Lead Channel Pacing Threshold Amplitude: 1 V
Lead Channel Pacing Threshold Amplitude: 1 V
Lead Channel Pacing Threshold Pulse Width: 0.5 ms
Lead Channel Pacing Threshold Pulse Width: 0.5 ms
Lead Channel Pacing Threshold Pulse Width: 0.5 ms
Lead Channel Pacing Threshold Pulse Width: 0.5 ms
Lead Channel Sensing Intrinsic Amplitude: 12 mV
Lead Channel Sensing Intrinsic Amplitude: 3.5 mV
Lead Channel Setting Pacing Amplitude: 1.25 V
Lead Channel Setting Pacing Amplitude: 1.75 V
Lead Channel Setting Pacing Pulse Width: 0.5 ms
Lead Channel Setting Sensing Sensitivity: 2 mV
Pulse Gen Model: 2272
Pulse Gen Serial Number: 8241895

## 2023-05-11 NOTE — Progress Notes (Signed)
 Normal dual chamber pacemaker wound check. Presenting rhythm: AS/VP-VS 60-142 (Frequent PVC's) . Wound well healed. Routine testing performed. Thresholds, sensing, and impedances consistent with implant measurements and at 3.5V safety margin/auto capture until 3 month visit. No episodes.  Note PVC burden is 8.9% (noted decrease since January OV documented at 12%).  Phone note sent to provider with PVC burden update.  Patient currently on Metoprolol.  Reviewed arm restrictions to continue for 6 weeks total post op.  Pt enrolled in remote follow-up.

## 2023-05-11 NOTE — Telephone Encounter (Signed)
 Patient in today to device clinic for 10 day post op dual chamber PPM implant on 04/28/23.   Noted in interrogation today frequent PVC'S on presenting.  PVC burden is 8.9%. (PVC burden was 12% at January OV while wearing a monitor).   Patient is asymptomatic and states she feels better since getting implant.   She is currently Metoprolol succinate 25mg  daily.  (This was decreased from 50mg  Aug 2024 due to renal impairment).      PRESENTING RHYTHM TODAY:

## 2023-05-22 ENCOUNTER — Telehealth: Payer: Self-pay | Admitting: Cardiology

## 2023-05-22 NOTE — Telephone Encounter (Signed)
 Spoke with the patient who states that she had asked the nurses in the device clinic when she was here for her wound check appointment if she needed to make any changes to her medications due to the extra heart beats she was having. Patient is currently on metoprolol 25 mg daily. Per notes from her appointment her PVC burden has decreased from 12 to 8.9%. Advised her to continue her current medications.

## 2023-05-22 NOTE — Telephone Encounter (Signed)
 Patient stated she had spoken with one of the EP nurses about if her medications needing to be adjusted due to them finding an extra heart beat. Patient stated she has not heard an update and was following up. Patient would like to know if she should be seen before 07/31/23 for EP. Please advise.

## 2023-05-27 ENCOUNTER — Other Ambulatory Visit: Payer: Self-pay | Admitting: Cardiology

## 2023-06-07 DIAGNOSIS — N1831 Chronic kidney disease, stage 3a: Secondary | ICD-10-CM | POA: Diagnosis not present

## 2023-06-07 DIAGNOSIS — E1122 Type 2 diabetes mellitus with diabetic chronic kidney disease: Secondary | ICD-10-CM | POA: Diagnosis not present

## 2023-06-07 DIAGNOSIS — I129 Hypertensive chronic kidney disease with stage 1 through stage 4 chronic kidney disease, or unspecified chronic kidney disease: Secondary | ICD-10-CM | POA: Diagnosis not present

## 2023-06-07 DIAGNOSIS — E559 Vitamin D deficiency, unspecified: Secondary | ICD-10-CM | POA: Diagnosis not present

## 2023-06-07 DIAGNOSIS — I509 Heart failure, unspecified: Secondary | ICD-10-CM | POA: Diagnosis not present

## 2023-06-07 DIAGNOSIS — E785 Hyperlipidemia, unspecified: Secondary | ICD-10-CM | POA: Diagnosis not present

## 2023-06-07 DIAGNOSIS — R768 Other specified abnormal immunological findings in serum: Secondary | ICD-10-CM | POA: Diagnosis not present

## 2023-06-08 LAB — LAB REPORT - SCANNED
Albumin, Urine POC: 13.3
Albumin/Creatinine Ratio, Urine, POC: 23
Creatinine, POC: 59 mg/dL
EGFR: 44

## 2023-06-13 ENCOUNTER — Ambulatory Visit

## 2023-06-13 DIAGNOSIS — I255 Ischemic cardiomyopathy: Secondary | ICD-10-CM

## 2023-06-14 LAB — CUP PACEART REMOTE DEVICE CHECK
Battery Remaining Longevity: 120 mo
Battery Remaining Percentage: 95.5 %
Battery Voltage: 3.01 V
Brady Statistic AP VP Percent: 30 %
Brady Statistic AP VS Percent: 16 %
Brady Statistic AS VP Percent: 14 %
Brady Statistic AS VS Percent: 30 %
Brady Statistic RA Percent Paced: 28 %
Brady Statistic RV Percent Paced: 44 %
Date Time Interrogation Session: 20250415020014
Implantable Lead Connection Status: 753985
Implantable Lead Connection Status: 753985
Implantable Lead Implant Date: 20250228
Implantable Lead Implant Date: 20250228
Implantable Lead Location: 753859
Implantable Lead Location: 753860
Implantable Pulse Generator Implant Date: 20250228
Lead Channel Impedance Value: 480 Ohm
Lead Channel Impedance Value: 510 Ohm
Lead Channel Pacing Threshold Amplitude: 0.625 V
Lead Channel Pacing Threshold Amplitude: 1 V
Lead Channel Pacing Threshold Pulse Width: 0.5 ms
Lead Channel Pacing Threshold Pulse Width: 0.5 ms
Lead Channel Sensing Intrinsic Amplitude: 2.5 mV
Lead Channel Sensing Intrinsic Amplitude: 3.2 mV
Lead Channel Setting Pacing Amplitude: 1.25 V
Lead Channel Setting Pacing Amplitude: 1.625
Lead Channel Setting Pacing Pulse Width: 0.5 ms
Lead Channel Setting Sensing Sensitivity: 2 mV
Pulse Gen Model: 2272
Pulse Gen Serial Number: 8241895

## 2023-07-28 NOTE — Addendum Note (Signed)
 Addended by: Lott Rouleau A on: 07/28/2023 12:30 PM   Modules accepted: Orders

## 2023-07-28 NOTE — Progress Notes (Signed)
 Remote pacemaker transmission.

## 2023-07-30 NOTE — Progress Notes (Unsigned)
  Electrophysiology Office Note:   Date:  07/31/2023  ID:  Molly Carpenter, DOB 1950/09/06, MRN [id number]  Primary Cardiologist: Peter [place], MD Primary Heart Failure: None Electrophysiologist: Will Cortland Ding, MD       History of Present Illness:   Molly Carpenter is a 73 y.o. female with h/o LBBB, HFrEF s/p dual chamber PPM (unable to place CS lead), CAD, HTN, HLD, DM II seen today for routine electrophysiology follow-up s/p Pacemaker implant.  Since last being seen in our clinic the patient reports she has been doing well overall. She just got back from the beach.  No specific device related concerns. She is unaware of extra beats / PVC's.     She denies chest pain, palpitations, dyspnea, PND, orthopnea, nausea, vomiting, dizziness, syncope, edema, weight gain, or early satiety.    Review of systems complete and found to be negative unless listed in HPI.   EP Information / Studies Reviewed:    EKG is ordered today. Personal review as below.  EKG Interpretation Date/Time:  Monday July 31 2023 11:21:40 EDT Ventricular Rate:  73 PR Interval:  188 QRS Duration:  152 QT Interval:  426 QTC Calculation: 469 R Axis:   -55  Text Interpretation: Normal sinus rhythm Left axis deviation Left bundle branch block Confirmed by Creighton Doffing ([id number]) on 07/31/2023 11:37:09 AM   PPM Interrogation-  reviewed in detail today,  See PACEART report.  Device History: Abbott Dual Chamber PPM implanted 04/28/23 for LBBB, HFrEF. Unable to place CS lead due to inability to pass wire. Pt wanted to avoid ICD.   Studies:  Cardiac Monitor 06/2021 > NSR, occ PAC's, frequent PVC's with 12.8% burden ECHO 03/2023 > LVEF 30-35%, LV global hypokinesis, G1DD   Arrhythmia / AAD LBBB + HFrEF > PPM            Physical Exam:   VS:  BP 114/66   Pulse 73   Ht 5\' 4"  (1.626 m)   Wt 151 lb (68.5 kg)   SpO2 97%   BMI 25.92 kg/m    Wt Readings from Last 3 Encounters:  07/31/23 151 lb (68.5 kg)   04/28/23 149 lb (67.6 kg)  03/21/23 150 lb 12.8 oz (68.4 kg)     GEN: Well nourished, well developed in no acute distress NECK: No JVD; No carotid bruits CARDIAC: Regular rate and rhythm, no murmurs, rubs, gallops RESPIRATORY:  Clear to auscultation without rales, wheezing or rhonchi  ABDOMEN: Soft, non-tender, non-distended EXTREMITIES:  No edema; No deformity   ASSESSMENT AND PLAN:    Chronic Systolic HF s/p Abbott Dual Chamber PPM  ICM  LBBB Unable pass CS lead. Pt wanted to avoid ICD.  -Normal PPM function -See Valeta Gaudier Art report -pre-procedure EKG QRS 158 ms, QRS on review 07/31/23 152 ms  -adjusted AVD to allow patient to conduct / reduce RV pacing % (43% burden)  PVC's  -10% burden on device  -continue metoprolol  25 mg daily  -schedule remote in 1 month to review for PVC burden > if remains elevated, consider increasing Toprol  to 37.5 mg daily > pt is asymptomatic  Hypertension  -well controlled on current regimen    CAD  -no anginal symptoms    -per Cardiology   Disposition:   Follow up with Dr. Lawana Pray in 12 months  Signed, Creighton Doffing, NP-C, AGACNP-BC [facility] - Electrophysiology  07/31/2023, 12:20 PM

## 2023-07-31 ENCOUNTER — Ambulatory Visit: Payer: Self-pay | Admitting: Cardiology

## 2023-07-31 ENCOUNTER — Encounter: Payer: Self-pay | Admitting: Pulmonary Disease

## 2023-07-31 ENCOUNTER — Ambulatory Visit: Payer: Medicare Other | Attending: Pulmonary Disease | Admitting: Pulmonary Disease

## 2023-07-31 VITALS — BP 114/66 | HR 73 | Ht 64.0 in | Wt 151.0 lb

## 2023-07-31 DIAGNOSIS — Z950 Presence of cardiac pacemaker: Secondary | ICD-10-CM | POA: Diagnosis not present

## 2023-07-31 DIAGNOSIS — I493 Ventricular premature depolarization: Secondary | ICD-10-CM | POA: Diagnosis not present

## 2023-07-31 DIAGNOSIS — I5022 Chronic systolic (congestive) heart failure: Secondary | ICD-10-CM

## 2023-07-31 DIAGNOSIS — I10 Essential (primary) hypertension: Secondary | ICD-10-CM

## 2023-07-31 DIAGNOSIS — I447 Left bundle-branch block, unspecified: Secondary | ICD-10-CM | POA: Diagnosis not present

## 2023-07-31 DIAGNOSIS — I255 Ischemic cardiomyopathy: Secondary | ICD-10-CM

## 2023-07-31 LAB — CUP PACEART INCLINIC DEVICE CHECK
Battery Remaining Longevity: 111 mo
Battery Voltage: 3.01 V
Brady Statistic RA Percent Paced: 26 %
Brady Statistic RV Percent Paced: 43 %
Date Time Interrogation Session: 20250602124634
Implantable Lead Connection Status: 753985
Implantable Lead Connection Status: 753985
Implantable Lead Implant Date: 20250228
Implantable Lead Implant Date: 20250228
Implantable Lead Location: 753859
Implantable Lead Location: 753860
Implantable Pulse Generator Implant Date: 20250228
Lead Channel Impedance Value: 425 Ohm
Lead Channel Impedance Value: 462.5 Ohm
Lead Channel Pacing Threshold Amplitude: 0.5 V
Lead Channel Pacing Threshold Amplitude: 1 V
Lead Channel Pacing Threshold Amplitude: 1 V
Lead Channel Pacing Threshold Pulse Width: 0.5 ms
Lead Channel Pacing Threshold Pulse Width: 0.5 ms
Lead Channel Pacing Threshold Pulse Width: 0.5 ms
Lead Channel Sensing Intrinsic Amplitude: 2.2 mV
Lead Channel Sensing Intrinsic Amplitude: 4.1 mV
Lead Channel Setting Pacing Amplitude: 1.5 V
Lead Channel Setting Pacing Amplitude: 2.5 V
Lead Channel Setting Pacing Pulse Width: 0.5 ms
Lead Channel Setting Sensing Sensitivity: 0.5 mV
Pulse Gen Model: 2272
Pulse Gen Serial Number: 8241895

## 2023-07-31 NOTE — Patient Instructions (Signed)
 Medication Instructions:   Your physician recommends that you continue on your current medications as directed. Please refer to the Current Medication list given to you today.  *If you need a refill on your cardiac medications before your next appointment, please call your pharmacy*   Lab Work: NONE ORDERED  TODAY   If you have labs (blood work) drawn today and your tests are completely normal, you will receive your results only by: MyChart Message (if you have MyChart) OR A paper copy in the mail If you have any lab test that is abnormal or we need to change your treatment, we will call you to review the results.    Testing/Procedures: NONE ORDERED  TODAY     Follow-Up: At [facility], you and your health needs are our priority.  As part of our continuing mission to provide you with exceptional heart care, our providers are all part of one team.  This team includes your primary Cardiologist (physician) and Advanced Practice Providers or APPs (Physician Assistants and Nurse Practitioners) who all work together to provide you with the care you need, when you need it.  Your next appointment:   1 year(s)   Provider:   You may see Will Cortland Ding, MD or one of the following Advanced Practice Providers on your designated Care Team:  Creighton Doffing, NP   We recommend signing up for the patient portal called "MyChart".  Sign up information is provided on this After Visit Summary.  MyChart is used to connect with patients for Virtual Visits (Telemedicine).  Patients are able to view lab/test results, encounter notes, upcoming appointments, etc.  Non-urgent messages can be sent to your provider as well.   To learn more about what you can do with MyChart, go to [email or web address].   Other Instructions

## 2023-08-11 NOTE — Progress Notes (Signed)
 Cardiology Office Note:    Date:  08/16/2023   ID:  Molly Carpenter, DOB 11-17-1950, MRN [id number]  PCP:  Corlis Pagan, NP  [facility] Cardiologist:  Hashim Eichhorst [place], MD  [facility] Electrophysiologist:  Will Gladis Norton, MD   Referring MD: Loreli Kins, MD   Chief Complaint  Patient presents with   Congestive Heart Failure     History of Present Illness:    Molly Carpenter is a 73 y.o. female with a hx of CAD, HTN, HLD, DM II and LBBB.  Patient had a cardiac catheterization in 2002 that showed moderate disease in the septal perforator branch.  Myoview  in 2012 showed fixed anterior apical and septal defect likely related to left bundle branch block, no ischemia was noted, EF 53%. Patient was on 05/22/2019 at which time she complained of more shortness of breath on exertion after she started exercising.  Echocardiogram performed on 06/04/2019 showed EF 40 to 45%, grade 2 DD.  Subsequent Myoview  obtained on 07/12/2019 showed EF 40%, medium defect present in the apical anterior, apical septal, and apex location consistent with left bundle branch artifact, no evidence of ischemia.  Given LV dysfunction, carvedilol  was added to her telmisartan  for heart failure medication titration. She later developed hypotension and telmisartan  dose was reduced. She was started on Jardiance .   She is enrolled in a study with [employer] that is looking at a drug that targets the IL-6 inflammasome.   In October 2022 we repeated an Echo that showed no change. EF 35-40%. We added aldactone  at that time. Repeat lab in November  showed increase in creatinine so aldactone  was stopped.  She was having  symptoms of lightheadedness and feeling out of focus.  We had her wear an event monitor which did show frequent PVCs. Recommended switching from Coreg  to Toprol  XL for better suppression of PVCs.   In July  had worsening renal function with GFR down to 16. Her ASA, Crestor , telmisartan  and metformin  were all stopped. She was referred to Nephrology. ARB stopped. Renal duplex was normal. Subsequent renal function improved back to her baseline with creatinine of 1.2. she sees Dr Tommas for her DM.   Echo in Jan showed EF remained at 30-35%. Seen by EP. Underwent dual chamber pacer implant. Unable to place CS lead. On follow up noted to have PVC burden of 10%.   On follow up today she notes she is doing well. Denies any SOB, chest pain, palpitations or dizziness. No edema. Weight is stable. Labs by nephrology in April stable.    Past Medical History:  Diagnosis Date   Chest tightness    Coronary artery disease    LHC 10/2000: dLAD 70-80% after a second septal perforator, EF 50%.  She was treated medically; Nuclear study 09/2010: EF 53%, fixed distal anterior, apical and septal defect most likely related to LBBB, no ischemia.    Diabetes mellitus    Hyperlipidemia    Hypertension    Hypotension    LBBB (left bundle branch block)     Past Surgical History:  Procedure Laterality Date   ABDOMINAL HYSTERECTOMY     BIV PACEMAKER INSERTION CRT-P N/A 04/28/2023   Procedure: BIV PACEMAKER INSERTION CRT-P;  Surgeon: Norton Soyla Gladis, MD;  Location: [facility];  Service: Cardiovascular;  Laterality: N/A;   CARDIAC CATHETERIZATION  11/21/2000   EF 55%. sHOWED A 70-80% STENOSIS IN THE SECOND SEPTAL PERFORATOR BRANCH.   CARDIOVASCULAR STRESS TEST  03/30/2006   EF  50%   CESAREAN SECTION     ROTATOR CUFF REPAIR     US  ECHOCARDIOGRAPHY  09/25/2008   EF 50%    Current Medications: Current Meds  Medication Sig   ACCU-CHEK GUIDE TEST test strip 1 each by Other route daily at 6 (six) AM.   aspirin  EC 81 MG tablet Take 1 tablet (81 mg total) by mouth daily. Swallow whole. (Patient taking differently: Take 81 mg by mouth every evening. Swallow whole.)   Blood Glucose Monitoring Suppl (ACCU-CHEK GUIDE) w/Device KIT    carboxymethylcellulose (LUBRICANT EYE DROPS) 0.5 % SOLN Place 1-2 drops into  both eyes 3 (three) times daily as needed (dry/irritated eyes.).   empagliflozin  (JARDIANCE ) 10 MG TABS tablet Take 1 tablet (10 mg total) by mouth daily.   metoprolol  succinate (TOPROL -XL) 25 MG 24 hr tablet Take 25 mg by mouth in the morning.   Multiple Vitamin (MULTI VITAMIN) TABS Take 1 capsule by mouth daily at 6 (six) AM.   NOVOLOG MIX 70/30 FLEXPEN (70-30) 100 UNIT/ML FlexPen Inject 12 Units into the skin 3 (three) times daily before meals.   rosuvastatin  (CRESTOR ) 10 MG tablet Take 1 tablet (10 mg total) by mouth daily. (Patient taking differently: Take 10 mg by mouth every evening.)     Allergies:   Promethazine-codeine and Semaglutide   Social History   Socioeconomic History   Marital status: Married    Spouse name: Not on file   Number of children: 2   Years of education: Not on file   Highest education level: Not on file  Occupational History   Occupation: Secondary school teacher    Comment: [employer]  Tobacco Use   Smoking status: Never   Smokeless tobacco: Never  Vaping Use   Vaping status: Never Used  Substance and Sexual Activity   Alcohol use: No    Alcohol/week: 0.0 standard drinks of alcohol   Drug use: No   Sexual activity: Not on file  Other Topics Concern   Not on file  Social History Narrative   Not on file   Social Drivers of Health   Financial Resource Strain: Not on file  Food Insecurity: Not on file  Transportation Needs: Not on file  Physical Activity: Not on file  Stress: Not on file  Social Connections: Not on file     Family History: The patient's family history includes Diabetes in her brother, brother, and sister; Heart attack in her brother and mother; Hypertension in her sister; Stroke in her father. There is no history of Breast cancer.  ROS:   Please see the history of present illness.     All other systems reviewed and are negative.  EKGs/Labs/Other Studies Reviewed:    The following studies were reviewed today:  Echo 06/04/2019  1. Left  ventricular ejection fraction, by estimation, is 40 to 45%. The  left ventricle has mildly decreased function. The left ventricle  demonstrates global hypokinesis. There is mild concentric left ventricular  hypertrophy. Left ventricular diastolic  parameters are consistent with Grade II diastolic dysfunction  (pseudonormalization). Elevated left atrial pressure.   2. Right ventricular systolic function is normal. The right ventricular  size is normal. Tricuspid regurgitation signal is inadequate for assessing  PA pressure.   3. The mitral valve is grossly normal. No evidence of mitral valve  regurgitation. No evidence of mitral stenosis.   4. The aortic valve is grossly normal. Aortic valve regurgitation is not  visualized. No aortic stenosis is present.   5. The inferior  vena cava is normal in size with greater than 50%  respiratory variability, suggesting right atrial pressure of 3 mmHg.   Myoview  07/12/2019 The left ventricular ejection fraction is moderately decreased (30-44%). Nuclear stress EF: 40%. There was no ST segment deviation noted during stress. Defect 1: There is a medium defect present in the apical anterior, apical septal and apex location consistent with LBBB artifact and unchanged from prior. This is an intermediate risk study due to reduced systolic function. There is no evidence of ischemia.  Echo 12/16/20: IMPRESSIONS     1. Left ventricular ejection fraction, by estimation, is 35 to 40%. The  left ventricle has moderately decreased function. The left ventricle has  no regional wall motion abnormalities. Left ventricular diastolic  parameters are consistent with Grade I  diastolic dysfunction (impaired relaxation).   2. Right ventricular systolic function is normal. The right ventricular  size is normal. There is normal pulmonary artery systolic pressure. The  estimated right ventricular systolic pressure is 13.0 mmHg.   3. The mitral valve is normal in  structure. Mild mitral valve  regurgitation. No evidence of mitral stenosis.   4. The aortic valve is normal in structure. Aortic valve regurgitation is  not visualized. No aortic stenosis is present.   5. The inferior vena cava is normal in size with greater than 50%  respiratory variability, suggesting right atrial pressure of 3 mmHg.   Comparison(s): No significant change from prior study. Prior images  reviewed side by side.   Event monitor 07/09/21: Study Highlights    Normal sinus rhythm Occasional PACs. one 6 beat run of SVT Frequent PVCs with burden 12.8%. some bigeminy and trigeminy     Patch Wear Time:  14 days and 0 hours (2023-04-22T17:39:14-0400 to 2023-05-06T17:39:18-0400)   Patient had a min HR of 50 bpm, max HR of 138 bpm, and avg HR of 68 bpm. Predominant underlying rhythm was Sinus Rhythm. Bundle Branch Block/IVCD was present. QRS morphology changes were present throughout recording. 1 run of Supraventricular Tachycardia  occurred lasting 6 beats with a max rate of 138 bpm (avg 125 bpm). Isolated SVEs were occasional (1.4%, 19579), SVE Couplets were rare (<1.0%, 1377), and SVE Triplets were rare (<1.0%, 26). Isolated VEs were frequent (12.8%, [id number]), VE Couplets were  rare (<1.0%, 6592), and VE Triplets were rare (<1.0%, 916). Ventricular Bigeminy and Trigeminy were present.         Recent Labs: 04/17/2023: BUN 27; Creatinine, Ser 1.43; Hemoglobin 13.7; Platelets 229; Potassium 4.2; Sodium 145  Recent Lipid Panel No results found for: CHOL, TRIG, HDL, CHOLHDL, VLDL, LDLCALC, LDLDIRECT   Dated 05/08/19: cholesterol 150, triglycerides 202, HDL 42, LDL 48.,  Dated 08/12/19: A1c 8%. Creatinine 1.11. otherwise CMET normal.  Dated 12/30/19: cholesterol 115, triglycerides 155, HDL 42, LDL 46. Creatinine 1.16. otherwise CMET normal Dated 03/31/20: A1c 7.1%.  Dated 07/01/20: A1c 7.5%. cholesterol  117, triglycerides 151, HDL 43, LDL 48. Creatinine 1.23. otherwise  CMET normal.  Dated 01/11/21: cholesterol 111, triglycerides 174, HDL 41, LDL 42 Dated 05/10/21: A1c 8%. Creatinine 1.23. otherwise CMET normal. Dated 08/30/21: A1c 8.6%.  Dated 10/05/21: creatinine 2.7. BUN 37. Hgb 11.1. otherwise CMET, CBC, Magnesium normal. Dated 11/02/21: normal CBC and chemistries Dated 12/01/21: A1c 5.6%.  Dated 04/04/22: cholesterol 109, HDL 37, LDL 45 Dated 06/06/22: triglycerides 117  Physical Exam:    VS:  BP 134/74   Pulse (!) 55   Ht 5' 4 (1.626 m)   Wt 151 lb 8 oz (68.7 kg)  BMI 26.00 kg/m     Wt Readings from Last 3 Encounters:  08/16/23 151 lb 8 oz (68.7 kg)  07/31/23 151 lb (68.5 kg)  04/28/23 149 lb (67.6 kg)     GEN:  Well nourished, well developed in no acute distress HEENT: Normal NECK: No JVD; No carotid bruits LYMPHATICS: No lymphadenopathy CARDIAC: RRR, no murmurs, rubs, gallops RESPIRATORY:  Clear to auscultation without rales, wheezing or rhonchi  ABDOMEN: Soft, non-tender, non-distended MUSCULOSKELETAL:  No edema; No deformity  SKIN: Warm and dry NEUROLOGIC:  Alert and oriented x 3 PSYCHIATRIC:  Normal affect   ASSESSMENT:    1. Chronic systolic CHF (congestive heart failure) (HCC)   2. LBBB (left bundle branch block)   3. PVC's (premature ventricular contractions)   4. Primary hypertension   5. Coronary artery disease involving native coronary artery of native heart without angina pectoris          PLAN:    In order of problems listed above:  1.   Chronic systolic CHF EF 35-40%. Class 1-2 symptoms. Unable to tolerate ARB or aldactone  due to renal effects. Will continue Toprol . She is on Jardiance  at 10 mg daily.  She is  Euvolemic. I suspect that  dyssynchrony with LBBB is playing a role in this. Underwent dual chamber pacer insertion in Feb 2025. Unable to place CS lead. Will plan to repeat Echo in 6 months  2. CAD: Class 1 angina. Continue ASA 81 mg daily and Crestor  10 mg daily.  3. Hypertension: currently well  controlled.   4. Hyperlipidemia: on Crestor . Labs per PCP  5. DM2: Managed by Dr Tommas. On insulin and Jardiance .   6. Left bundle branch block: Chronic  7. PVCs. Burden of 10% on pacer check- similar to baseline. Asymptomatic currently. Continue Metoprolol     Medication Adjustments/Labs and Tests Ordered: Current medicines are reviewed at length with the patient today.  Concerns regarding medicines are outlined above.  Orders Placed This Encounter  Procedures   ECHOCARDIOGRAM COMPLETE    No orders of the defined types were placed in this encounter.    There are no Patient Instructions on file for this visit.  Follow up with me in 6 months  Signed, Melondy Blanchard [place], MD  08/16/2023 9:55 AM    [facility]

## 2023-08-14 ENCOUNTER — Telehealth: Payer: Self-pay | Admitting: Cardiology

## 2023-08-14 NOTE — Telephone Encounter (Signed)
   Pre-operative Risk Assessment    Patient Name: Molly Carpenter  DOB: [birth year] MRN: [id number]      Request for Surgical Clearance    Procedure:  Below the gum cleaning  Date of Surgery:  Clearance 08/14/23                                 Surgeon:  [employer] [employer] Surgeon's Group or Practice Name:  Saratha Cunas DDS Phone number:  [phone] Fax number:  [phone]   Type of Clearance Requested:   - Medical    Type of Anesthesia:  None    Additional requests/questions:  Does this patient need antibiotics?  Macy Scale   08/14/2023, 8:12 AM

## 2023-08-14 NOTE — Telephone Encounter (Signed)
 Spoke with the dentist office and confirmed that the Surgeon is Dr Saratha Cunas, procedure is below the gum cleaning, with no anesthesia.   Requesting office confirming if patient needs antibiotics for the below the gum cleaning. Sent information to Morey Ar, NP.  Morey Ar, NP confirmed that patient does not need antibiotics and would send over the clearance to the dental office. I verbally informed the dental office of this information.

## 2023-08-14 NOTE — Telephone Encounter (Signed)
   Patient Name: Molly Carpenter  DOB: 30-Jan-1951 MRN: [id number]  Primary Cardiologist: Peter [place], MD  Chart reviewed as part of pre-operative protocol coverage.   Dental cleanings are considered low risk procedures per guidelines and generally do not require any specific cardiac clearance. It is also generally accepted that for extractions of 1-2 teeth and dental cleanings, there is no need to interrupt blood thinner therapy.  SBE prophylaxis is not required for the patient from a cardiac standpoint.  I will route this recommendation to the requesting party via Epic fax function and remove from pre-op pool.  Please call with questions.  Morey Ar, NP 08/14/2023, 8:24 AM

## 2023-08-16 ENCOUNTER — Ambulatory Visit: Payer: Medicare Other | Attending: Cardiology | Admitting: Cardiology

## 2023-08-16 VITALS — BP 134/74 | HR 55 | Ht 64.0 in | Wt 151.5 lb

## 2023-08-16 DIAGNOSIS — I5022 Chronic systolic (congestive) heart failure: Secondary | ICD-10-CM | POA: Diagnosis not present

## 2023-08-16 DIAGNOSIS — I493 Ventricular premature depolarization: Secondary | ICD-10-CM | POA: Diagnosis not present

## 2023-08-16 DIAGNOSIS — I10 Essential (primary) hypertension: Secondary | ICD-10-CM | POA: Diagnosis not present

## 2023-08-16 DIAGNOSIS — I2510 Atherosclerotic heart disease of native coronary artery without angina pectoris: Secondary | ICD-10-CM | POA: Diagnosis not present

## 2023-08-16 DIAGNOSIS — I447 Left bundle-branch block, unspecified: Secondary | ICD-10-CM

## 2023-08-16 NOTE — Patient Instructions (Signed)
 Medication Instructions:  Continue same medications *If you need a refill on your cardiac medications before your next appointment, please call your pharmacy*  Lab Work: None ordered  Testing/Procedures: Schedule Echo in Dec   Follow-Up: At [facility], you and your health needs are our priority.  As part of our continuing mission to provide you with exceptional heart care, our providers are all part of one team.  This team includes your primary Cardiologist (physician) and Advanced Practice Providers or APPs (Physician Assistants and Nurse Practitioners) who all work together to provide you with the care you need, when you need it.  Your next appointment:  6 months  Call in July to schedule Dec appointment     Provider:  Dr.Jordan   We recommend signing up for the patient portal called MyChart.  Sign up information is provided on this After Visit Summary.  MyChart is used to connect with patients for Virtual Visits (Telemedicine).  Patients are able to view lab/test results, encounter notes, upcoming appointments, etc.  Non-urgent messages can be sent to your provider as well.   To learn more about what you can do with MyChart, go to [email or web address].

## 2023-08-29 DIAGNOSIS — N1832 Chronic kidney disease, stage 3b: Secondary | ICD-10-CM | POA: Diagnosis not present

## 2023-08-29 DIAGNOSIS — E782 Mixed hyperlipidemia: Secondary | ICD-10-CM | POA: Diagnosis not present

## 2023-08-29 DIAGNOSIS — I10 Essential (primary) hypertension: Secondary | ICD-10-CM | POA: Diagnosis not present

## 2023-08-29 DIAGNOSIS — E1122 Type 2 diabetes mellitus with diabetic chronic kidney disease: Secondary | ICD-10-CM | POA: Diagnosis not present

## 2023-08-29 DIAGNOSIS — R748 Abnormal levels of other serum enzymes: Secondary | ICD-10-CM | POA: Diagnosis not present

## 2023-09-05 ENCOUNTER — Telehealth: Payer: Self-pay | Admitting: Cardiology

## 2023-09-05 DIAGNOSIS — I2510 Atherosclerotic heart disease of native coronary artery without angina pectoris: Secondary | ICD-10-CM | POA: Diagnosis not present

## 2023-09-05 DIAGNOSIS — E782 Mixed hyperlipidemia: Secondary | ICD-10-CM | POA: Diagnosis not present

## 2023-09-05 DIAGNOSIS — E1122 Type 2 diabetes mellitus with diabetic chronic kidney disease: Secondary | ICD-10-CM | POA: Diagnosis not present

## 2023-09-05 DIAGNOSIS — I10 Essential (primary) hypertension: Secondary | ICD-10-CM | POA: Diagnosis not present

## 2023-09-05 MED ORDER — EMPAGLIFLOZIN 10 MG PO TABS: 10.0000 mg | ORAL_TABLET | Freq: Every day | ORAL | 0 refills | Status: DC

## 2023-09-05 NOTE — Telephone Encounter (Signed)
 Patient calling the office for samples of medication:   1.  What medication and dosage are you requesting samples for? empagliflozin  (JARDIANCE ) 10 MG TABS tablet   2.  Are you currently out of this medication? She will be in a few days. She states pharmacy told her supply will be coming in late.

## 2023-09-05 NOTE — Telephone Encounter (Signed)
 Medication name/dosage: Samples List: Jardiance  10 mg  Administration instructions: 1 tablet by mouth daily  Reason for samples: Pharmacy is back ordered  Ordering provider: Peter [place], MD  *Once above information entered, route the phone encounter to CV DIV MAG ST SAMPLES and send Teams message to team member assigned to Samples for the day.

## 2023-09-05 NOTE — Telephone Encounter (Signed)
 Jardiance  10 mg by mouth daily samples for 14 days is ready for a patient to pick at the front desk, 5 th floor, coumadin clinic. Patient did not answer the call, left voice mail to call back.

## 2023-09-05 NOTE — Addendum Note (Signed)
 Addended by: TAFFY LOVEY KIDD on: 09/05/2023 01:40 PM   Modules accepted: Orders

## 2023-09-12 ENCOUNTER — Telehealth: Payer: Self-pay

## 2023-09-12 ENCOUNTER — Ambulatory Visit (INDEPENDENT_AMBULATORY_CARE_PROVIDER_SITE_OTHER)

## 2023-09-12 DIAGNOSIS — I255 Ischemic cardiomyopathy: Secondary | ICD-10-CM

## 2023-09-12 NOTE — Telephone Encounter (Signed)
 Acute transmission received per Daphne Barrack, NP request.   RV pacing percentage is down from 40% to 4.3% with adjustments made at last OV for AV delays.  PVC burden is 11%, increase from 10% at last OV.  Normal device function, no episodes.   Forwarding to Daphne Barrack, NP for follow up.

## 2023-09-12 NOTE — Telephone Encounter (Signed)
-----   Message from Nurse Alan ORN sent at 07/31/2023  3:38 PM EDT ----- Review 09/12/23 remote transmission for the following: ----- Message ----- From: Aniceto Daphne CROME, NP Sent: 07/31/2023  12:42 PM EDT To: [facility] Device  Hi team,   Can we please pull a remote on her in 1 month to review PVC burden and RV pacing percentage?  She has a dual chamber PPM.  I adjusted her PAVD/SAVD to allow for conduction. I'll be on the look out for it.  Thanks so much!!   [employer]

## 2023-09-13 ENCOUNTER — Ambulatory Visit: Payer: Self-pay | Admitting: Cardiology

## 2023-09-13 LAB — CUP PACEART REMOTE DEVICE CHECK
Battery Remaining Longevity: 114 mo
Battery Remaining Percentage: 95.5 %
Battery Voltage: 3.01 V
Brady Statistic AP VP Percent: 3.4 %
Brady Statistic AP VS Percent: 47 %
Brady Statistic AS VP Percent: 1 %
Brady Statistic AS VS Percent: 37 %
Brady Statistic RA Percent Paced: 29 %
Brady Statistic RV Percent Paced: 4.3 %
Date Time Interrogation Session: 20250715020012
Implantable Lead Connection Status: 753985
Implantable Lead Connection Status: 753985
Implantable Lead Implant Date: 20250228
Implantable Lead Implant Date: 20250228
Implantable Lead Location: 753859
Implantable Lead Location: 753860
Implantable Pulse Generator Implant Date: 20250228
Lead Channel Impedance Value: 440 Ohm
Lead Channel Impedance Value: 510 Ohm
Lead Channel Pacing Threshold Amplitude: 0.75 V
Lead Channel Pacing Threshold Amplitude: 1 V
Lead Channel Pacing Threshold Pulse Width: 0.5 ms
Lead Channel Pacing Threshold Pulse Width: 0.5 ms
Lead Channel Sensing Intrinsic Amplitude: 2.1 mV
Lead Channel Sensing Intrinsic Amplitude: 3.6 mV
Lead Channel Setting Pacing Amplitude: 1.75 V
Lead Channel Setting Pacing Amplitude: 2.5 V
Lead Channel Setting Pacing Pulse Width: 0.5 ms
Lead Channel Setting Sensing Sensitivity: 0.5 mV
Pulse Gen Model: 2272
Pulse Gen Serial Number: 8241895

## 2023-10-03 DIAGNOSIS — I10 Essential (primary) hypertension: Secondary | ICD-10-CM | POA: Diagnosis not present

## 2023-10-03 DIAGNOSIS — I2510 Atherosclerotic heart disease of native coronary artery without angina pectoris: Secondary | ICD-10-CM | POA: Diagnosis not present

## 2023-10-03 DIAGNOSIS — N1832 Chronic kidney disease, stage 3b: Secondary | ICD-10-CM | POA: Diagnosis not present

## 2023-10-03 DIAGNOSIS — R748 Abnormal levels of other serum enzymes: Secondary | ICD-10-CM | POA: Diagnosis not present

## 2023-10-03 DIAGNOSIS — Z0000 Encounter for general adult medical examination without abnormal findings: Secondary | ICD-10-CM | POA: Diagnosis not present

## 2023-10-03 DIAGNOSIS — E1169 Type 2 diabetes mellitus with other specified complication: Secondary | ICD-10-CM | POA: Diagnosis not present

## 2023-10-03 DIAGNOSIS — E782 Mixed hyperlipidemia: Secondary | ICD-10-CM | POA: Diagnosis not present

## 2023-10-03 DIAGNOSIS — E1122 Type 2 diabetes mellitus with diabetic chronic kidney disease: Secondary | ICD-10-CM | POA: Diagnosis not present

## 2023-10-23 ENCOUNTER — Other Ambulatory Visit: Payer: Self-pay | Admitting: Cardiology

## 2023-11-17 ENCOUNTER — Telehealth: Payer: Self-pay | Admitting: Cardiology

## 2023-11-17 NOTE — Telephone Encounter (Signed)
  Patient states she is going on a [place] fishing trip and her PCP gave her some patches for motion sickness (Scopolamine). She is asking if she is okay to use those with her heart medications. Please advise.

## 2023-11-20 NOTE — Telephone Encounter (Signed)
 Spoke with the patient and she is aware that she is okay to use scopolamine patch.

## 2023-11-20 NOTE — Telephone Encounter (Signed)
 She is fine to use the scopolamine

## 2023-12-06 NOTE — Progress Notes (Signed)
 Remote PPM Transmission

## 2023-12-11 DIAGNOSIS — S82201A Unspecified fracture of shaft of right tibia, initial encounter for closed fracture: Secondary | ICD-10-CM | POA: Diagnosis not present

## 2023-12-11 DIAGNOSIS — I10 Essential (primary) hypertension: Secondary | ICD-10-CM | POA: Diagnosis not present

## 2023-12-11 DIAGNOSIS — Z7984 Long term (current) use of oral hypoglycemic drugs: Secondary | ICD-10-CM | POA: Diagnosis not present

## 2023-12-11 DIAGNOSIS — Z950 Presence of cardiac pacemaker: Secondary | ICD-10-CM | POA: Diagnosis not present

## 2023-12-11 DIAGNOSIS — Z79899 Other long term (current) drug therapy: Secondary | ICD-10-CM | POA: Diagnosis not present

## 2023-12-11 DIAGNOSIS — Z7985 Long-term (current) use of injectable non-insulin antidiabetic drugs: Secondary | ICD-10-CM | POA: Diagnosis not present

## 2023-12-11 DIAGNOSIS — S82451A Displaced comminuted fracture of shaft of right fibula, initial encounter for closed fracture: Secondary | ICD-10-CM | POA: Diagnosis not present

## 2023-12-11 DIAGNOSIS — S82851A Displaced trimalleolar fracture of right lower leg, initial encounter for closed fracture: Secondary | ICD-10-CM | POA: Insufficient documentation

## 2023-12-11 DIAGNOSIS — S9304XA Dislocation of right ankle joint, initial encounter: Secondary | ICD-10-CM | POA: Diagnosis not present

## 2023-12-11 DIAGNOSIS — E119 Type 2 diabetes mellitus without complications: Secondary | ICD-10-CM | POA: Diagnosis not present

## 2023-12-11 DIAGNOSIS — Z7982 Long term (current) use of aspirin: Secondary | ICD-10-CM | POA: Diagnosis not present

## 2023-12-12 ENCOUNTER — Ambulatory Visit

## 2023-12-12 ENCOUNTER — Other Ambulatory Visit: Payer: Self-pay | Admitting: Orthopaedic Surgery

## 2023-12-12 DIAGNOSIS — I255 Ischemic cardiomyopathy: Secondary | ICD-10-CM

## 2023-12-12 DIAGNOSIS — M79671 Pain in right foot: Secondary | ICD-10-CM | POA: Diagnosis not present

## 2023-12-12 DIAGNOSIS — S82851A Displaced trimalleolar fracture of right lower leg, initial encounter for closed fracture: Secondary | ICD-10-CM | POA: Diagnosis not present

## 2023-12-12 DIAGNOSIS — M25579 Pain in unspecified ankle and joints of unspecified foot: Secondary | ICD-10-CM | POA: Insufficient documentation

## 2023-12-13 ENCOUNTER — Encounter: Payer: Self-pay | Admitting: Cardiology

## 2023-12-13 ENCOUNTER — Encounter (HOSPITAL_COMMUNITY): Payer: Self-pay | Admitting: Orthopaedic Surgery

## 2023-12-13 ENCOUNTER — Other Ambulatory Visit: Payer: Self-pay

## 2023-12-13 DIAGNOSIS — E782 Mixed hyperlipidemia: Secondary | ICD-10-CM | POA: Diagnosis not present

## 2023-12-13 DIAGNOSIS — Z0000 Encounter for general adult medical examination without abnormal findings: Secondary | ICD-10-CM | POA: Diagnosis not present

## 2023-12-13 DIAGNOSIS — E1122 Type 2 diabetes mellitus with diabetic chronic kidney disease: Secondary | ICD-10-CM | POA: Diagnosis not present

## 2023-12-13 NOTE — Progress Notes (Signed)
 Anesthesia Chart Review: Same day workup  73 year old female with pertinent history including CAD, HTN, HLD, left bundle branch block, frequent PVCs, HFrEF, s/p Abbott dual-chamber PPM (unable to place coronary sinus lead).  Remote cath 2002 that showed moderate disease in the septal perforator branch. Myoview  in 2012 showed fixed anterior apical and septal defect likely related to left bundle branch block, no ischemia was noted, EF 53%. Echo 06/04/2019 showed EF 40 to 45%, grade 2 DD. Subsequent Myoview   07/12/2019 showed EF 40%, medium defect present in the apical anterior, apical septal, and apex location consistent with left bundle branch artifact, no evidence of ischemia. Given LV dysfunction, carvedilol  was added to her telmisartan  for heart failure medication titration. She later developed hypotension and telmisartan  dose was reduced. She was started on Jardiance .  Repeat echo 11/2020 showed persistently depressed LVEF 35 to 40%.  Aldactone  was started but later discontinued due to increasing creatinine.  Event monitor showed frequent PVCs and her Coreg  was switched to Toprol -XL.  She had subsequent deterioration in renal function and her ASA, Crestor , telmisartan , and metformin were all discontinued.  Renal function improved back to baseline.  Subsequent echocardiogram in 03/2023 showed EF of 30 to 35%.  She was seen by EP and underwent Abbott dual-chamber PPM (unable to pass a CS lead).  10% PVC burden on device.  Last seen in follow-up by primary cardiologist Dr. [place] on 08/16/2023.  Noted to be euvolemic at that time.  She was continued on Toprol  and Jardiance .  Other pertinent history includes IDDM2.  Patient will need day of surgery labs and evaluation.  EKG 07/31/2023: Normal sinus rhythm.  Rate 73. Left axis deviation. Left bundle branch block  Perioperative prescription for implanted cardiac device programming per progress note 12/13/2023: Device Information:   Clinic EP Physician:  Soyla Norton, MD    Device Type:  Pacemaker Manufacturer and Phone #:  [employer] Jude/Abbott: [phone] Pacemaker Dependent?:  No. Date of Last Device Check:  12/12/2023      Normal Device Function?:  Yes.     Electrophysiologist's Recommendations:   Have magnet available. Provide continuous ECG monitoring when magnet is used or reprogramming is to be performed.  Procedure should not interfere with device function.  No device programming or magnet placement needed.   TTE 03/09/2023: 1. Left ventricular ejection fraction, by estimation, is 30 to 35%. The  left ventricle has moderately decreased function. The left ventricle  demonstrates global hypokinesis. There is mild left ventricular  hypertrophy of the basal-septal segment. Left  ventricular diastolic parameters are consistent with Grade I diastolic  dysfunction (impaired relaxation). Elevated left ventricular end-diastolic  pressure.   2. Right ventricular systolic function is normal. The right ventricular  size is normal. Tricuspid regurgitation signal is inadequate for assessing  PA pressure.   3. A small pericardial effusion is present. The pericardial effusion is  anterior to the right ventricle.   4. The mitral valve is normal in structure. Mild mitral valve  regurgitation. No evidence of mitral stenosis.   5. The aortic valve was not well visualized. Aortic valve regurgitation  is trivial. Aortic valve sclerosis/calcification is present, without any  evidence of aortic stenosis.   6. The inferior vena cava is normal in size with greater than 50%  respiratory variability, suggesting right atrial pressure of 3 mmHg.   Nuclear stress 07/12/2019: The left ventricular ejection fraction is moderately decreased (30-44%). Nuclear stress EF: 40%. There was no ST segment deviation noted during stress. Defect 1:  There is a medium defect present in the apical anterior, apical septal and apex location consistent with LBBB artifact and  unchanged from prior. This is an intermediate risk study due to reduced systolic function. There is no evidence of ischemia.     Lynwood Geofm RIGGERS [facility] Short Stay Center/Anesthesiology Phone [phone] 12/13/2023 11:02 AM

## 2023-12-13 NOTE — Progress Notes (Signed)
 SDW call  Patient was given pre-op instructions over the phone. Patient verbalized understanding of instructions provided.     PCP - Izetta Cork, NP Endocrinologist: Peggi Caffey Cardiologist - Dr. Peter Swaziland, LOV 08/16/2023 EP Cardiologist: Dr. Soyla Norton Pulmonary:    PPM/ICD - Pacemaker, Abbott Device Orders - received and on the chart Rep Notified - na   Chest x-ray - 04/28/2023 EKG -  07/31/2023 Stress Test - 07/12/2019 ECHO - 03/09/2023 Cardiac Cath - 11/21/2000  Sleep Study/sleep apnea/CPAP: denies  Type II diabetic. Last A1C 7.8 in 2024 Fasting Blood sugar range: 100-132 How often check sugars: daily Jardiance , hold DOS Tresiba, use 10 units DOS which is 50% of regular dose  GLP1: Ozempic, last dose 12/05/2023   Blood Thinner Instructions: denies Aspirin  Instructions:last dose 12/13/2023   ERAS Protcol - Clears until 0630   Anesthesia review: Yes. CHF, CAD, HTN, DM, LBBB, pacemaker   Patient denies shortness of breath, fever, cough and chest pain over the phone call  Your procedure is scheduled on Thursday December 14, 2023  Report to [facility] Main Entrance A at  0700  A.M., then check in with the Admitting office.  Call this number if you have problems the morning of surgery:  [phone]   If you have any questions prior to your surgery date call [phone]: Open Monday-Friday 8am-4pm If you experience any cold or flu symptoms such as cough, fever, chills, shortness of breath, etc. between now and your scheduled surgery, please notify us  at the above number     Remember:  Do not eat after midnight the night before your surgery  You may drink clear liquids until  0630   the morning of your surgery.   Clear liquids allowed are: Water, Non-Citrus Juices (without pulp), Carbonated Beverages, Clear Tea, Black Coffee ONLY (NO MILK, CREAM OR POWDERED CREAMER of any kind), and Gatorade   Take these medicines the morning of surgery with A SIP OF WATER:   metoprolol   As needed: Norco, robaxin  As of today, STOP taking any Aspirin  (unless otherwise instructed by your surgeon) Aleve, Naproxen, Ibuprofen, Motrin, Advil, Goody's, BC's, all herbal medications, fish oil, and all vitamins.

## 2023-12-13 NOTE — Progress Notes (Signed)
 PERIOPERATIVE PRESCRIPTION FOR IMPLANTED CARDIAC DEVICE PROGRAMMING  Patient Information: Name:  Molly Carpenter  DOB:  01-05-1951  MRN:  [id number]    Planned Procedure:  Open treatment of right trimalleolar ankle fracture without posterior fixation, possible syndesmosis  Surgeon:  Dr. Lonni Pae  Date of Procedure:  12/14/2023  Cautery will be used.  Position during surgery:  Supine   Please send documentation back to:  [facility] (Fax # [phone])  Device Information:  Clinic EP Physician:  Soyla Norton, MD   Device Type:  Pacemaker Manufacturer and Phone #:  St. Jude/Abbott: [phone] Pacemaker Dependent?:  No. Date of Last Device Check:  12/12/2023  Normal Device Function?:  Yes.    Electrophysiologist's Recommendations:  Have magnet available. Provide continuous ECG monitoring when magnet is used or reprogramming is to be performed.  Procedure should not interfere with device function.  No device programming or magnet placement needed.  Per Device Clinic Standing Orders, [name], RN  7:28 AM 12/13/2023

## 2023-12-13 NOTE — Anesthesia Preprocedure Evaluation (Signed)
 Anesthesia Evaluation  Patient identified by MRN, date of birth, ID band Patient awake    Reviewed: Allergy & Precautions, NPO status , Patient's Chart, lab work & pertinent test results  History of Anesthesia Complications (+) PONV and history of anesthetic complications  Airway Mallampati: II  TM Distance: >3 FB Neck ROM: Full    Dental no notable dental hx. (+) Teeth Intact   Pulmonary neg pulmonary ROS, neg sleep apnea, neg COPD, Patient abstained from smoking.Not current smoker   Pulmonary exam normal breath sounds clear to auscultation       Cardiovascular Exercise Tolerance: Good METShypertension, + CAD and +CHF  (-) Past MI (-) dysrhythmias + pacemaker  Rhythm:Regular Rate:Normal - Systolic murmurs Good functional status   Neuro/Psych negative neurological ROS  negative psych ROS   GI/Hepatic ,neg GERD  ,,(+)     (-) substance abuse    Endo/Other  diabetes, Well Controlled, Insulin Dependent  Last GLP1 agonist taken 9 days ago. Denies GI symptoms today  Renal/GU negative Renal ROS     Musculoskeletal   Abdominal   Peds  Hematology   Anesthesia Other Findings 73 year old female with pertinent history including CAD, HTN, HLD, left bundle branch block, frequent PVCs, HFrEF, s/p Abbott dual-chamber PPM (unable to place coronary sinus lead).  Remote cath 2002 that showed moderate disease in the septal perforator branch. Myoview  in 2012 showed fixed anterior apical and septal defect likely related to left bundle branch block, no ischemia was noted, EF 53%. Echo 06/04/2019 showed EF 40 to 45%, grade 2 DD. Subsequent Myoview   07/12/2019 showed EF 40%, medium defect present in the apical anterior, apical septal, and apex location consistent with left bundle branch artifact, no evidence of ischemia. Given LV dysfunction, carvedilol  was added to her telmisartan  for heart failure medication titration. She later developed  hypotension and telmisartan  dose was reduced. She was started on Jardiance .  Repeat echo 11/2020 showed persistently depressed LVEF 35 to 40%.  Aldactone  was started but later discontinued due to increasing creatinine.  Event monitor showed frequent PVCs and her Coreg  was switched to Toprol -XL.  She had subsequent deterioration in renal function and her ASA, Crestor , telmisartan , and metformin were all discontinued.  Renal function improved back to baseline.  Subsequent echocardiogram in 03/2023 showed EF of 30 to 35%.  She was seen by EP and underwent Abbott dual-chamber PPM (unable to pass a CS lead).  10% PVC burden on device.  Last seen in follow-up by primary cardiologist Dr. Swaziland on 08/16/2023.  Noted to be euvolemic at that time.  She was continued on Toprol  and Jardiance .   Other pertinent history includes IDDM2.   Patient will need day of surgery labs and evaluation.   EKG 07/31/2023: Normal sinus rhythm.  Rate 73. Left axis deviation. Left bundle branch block   Perioperative prescription for implanted cardiac device programming per progress note 12/13/2023: Device Information:   Clinic EP Physician:  Soyla Norton, MD    Device Type:  Pacemaker Manufacturer and Phone #:  St. Jude/Abbott: [phone] Pacemaker Dependent?:  No. Date of Last Device Check:  12/12/2023      Normal Device Function?:  Yes.     Electrophysiologist's Recommendations:    Have magnet available.  Provide continuous ECG monitoring when magnet is used or reprogramming is to be performed.   Procedure should not interfere with device function.  No device programming or magnet placement needed.     TTE 03/09/2023: 1. Left ventricular ejection fraction, by estimation, is  30 to 35%. The  left ventricle has moderately decreased function. The left ventricle  demonstrates global hypokinesis. There is mild left ventricular  hypertrophy of the basal-septal segment. Left  ventricular diastolic parameters are  consistent with Grade I diastolic  dysfunction (impaired relaxation). Elevated left ventricular end-diastolic  pressure.   2. Right ventricular systolic function is normal. The right ventricular  size is normal. Tricuspid regurgitation signal is inadequate for assessing  PA pressure.   3. A small pericardial effusion is present. The pericardial effusion is  anterior to the right ventricle.   4. The mitral valve is normal in structure. Mild mitral valve  regurgitation. No evidence of mitral stenosis.   5. The aortic valve was not well visualized. Aortic valve regurgitation  is trivial. Aortic valve sclerosis/calcification is present, without any  evidence of aortic stenosis.   6. The inferior vena cava is normal in size with greater than 50%  respiratory variability, suggesting right atrial pressure of 3 mmHg.    Nuclear stress 07/12/2019:  The left ventricular ejection fraction is moderately decreased (30-44%).  Nuclear stress EF: 40%.  There was no ST segment deviation noted during stress.  Defect 1: There is a medium defect present in the apical anterior, apical septal and apex location consistent with LBBB artifact and unchanged from prior.  This is an intermediate risk study due to reduced systolic function.  There is no evidence of ischemia.   Reproductive/Obstetrics                              Anesthesia Physical Anesthesia Plan  ASA: 3  Anesthesia Plan: Regional   Post-op Pain Management: Minimal or no pain anticipated, Regional block* and Tylenol  PO (pre-op)*   Induction: Intravenous  PONV Risk Score and Plan: 3 and Propofol infusion, TIVA, Ondansetron , Midazolam  and Dexamethasone  Airway Management Planned: Natural Airway and Nasal Cannula  Additional Equipment: None  Intra-op Plan:   Post-operative Plan: Extubation in OR  Informed Consent: I have reviewed the patients History and Physical, chart, labs and discussed the procedure  including the risks, benefits and alternatives for the proposed anesthesia with the patient or authorized representative who has indicated his/her understanding and acceptance.     Dental advisory given  Plan Discussed with: CRNA and Surgeon  Anesthesia Plan Comments: (Discussed risks of regional anesthesia with patient, including possibility of difficulty with spontaneous ventilation under anesthesia necessitating airway intervention, PONV, and rare risks such as cardiac or respiratory or neurological events, and allergic reactions. Discussed the role of CRNA in patient's perioperative care. Patient understands.  Discussed r/b/a of adductor canal and popliteal nerve blocks, including:  - bleeding, infection, nerve damage - poor or non functioning block. - reactions and toxicity to local anesthetic Patient understands. )         Anesthesia Quick Evaluation

## 2023-12-14 ENCOUNTER — Ambulatory Visit (HOSPITAL_COMMUNITY)
Admission: RE | Admit: 2023-12-14 | Discharge: 2023-12-14 | Disposition: A | Discharge status: Home / Self Care | Attending: Orthopaedic Surgery | Admitting: Orthopaedic Surgery

## 2023-12-14 ENCOUNTER — Ambulatory Visit (HOSPITAL_BASED_OUTPATIENT_CLINIC_OR_DEPARTMENT_OTHER): Admitting: Physician Assistant

## 2023-12-14 ENCOUNTER — Other Ambulatory Visit (HOSPITAL_COMMUNITY): Payer: Self-pay

## 2023-12-14 ENCOUNTER — Ambulatory Visit (HOSPITAL_COMMUNITY)

## 2023-12-14 ENCOUNTER — Ambulatory Visit (HOSPITAL_COMMUNITY): Admitting: Physician Assistant

## 2023-12-14 ENCOUNTER — Encounter (HOSPITAL_COMMUNITY): Payer: Self-pay | Admitting: Orthopaedic Surgery

## 2023-12-14 ENCOUNTER — Encounter (HOSPITAL_COMMUNITY): Admission: RE | Disposition: A | Payer: Self-pay | Source: Home / Self Care | Attending: Orthopaedic Surgery

## 2023-12-14 DIAGNOSIS — Z4789 Encounter for other orthopedic aftercare: Secondary | ICD-10-CM | POA: Diagnosis not present

## 2023-12-14 DIAGNOSIS — Z950 Presence of cardiac pacemaker: Secondary | ICD-10-CM | POA: Insufficient documentation

## 2023-12-14 DIAGNOSIS — I509 Heart failure, unspecified: Secondary | ICD-10-CM | POA: Insufficient documentation

## 2023-12-14 DIAGNOSIS — I2510 Atherosclerotic heart disease of native coronary artery without angina pectoris: Secondary | ICD-10-CM

## 2023-12-14 DIAGNOSIS — W19XXXA Unspecified fall, initial encounter: Secondary | ICD-10-CM | POA: Diagnosis not present

## 2023-12-14 DIAGNOSIS — Z794 Long term (current) use of insulin: Secondary | ICD-10-CM | POA: Insufficient documentation

## 2023-12-14 DIAGNOSIS — S82851A Displaced trimalleolar fracture of right lower leg, initial encounter for closed fracture: Secondary | ICD-10-CM | POA: Insufficient documentation

## 2023-12-14 DIAGNOSIS — E119 Type 2 diabetes mellitus without complications: Secondary | ICD-10-CM | POA: Insufficient documentation

## 2023-12-14 DIAGNOSIS — Z87891 Personal history of nicotine dependence: Secondary | ICD-10-CM | POA: Diagnosis not present

## 2023-12-14 DIAGNOSIS — I110 Hypertensive heart disease with heart failure: Secondary | ICD-10-CM | POA: Diagnosis not present

## 2023-12-14 DIAGNOSIS — S93431A Sprain of tibiofibular ligament of right ankle, initial encounter: Secondary | ICD-10-CM | POA: Insufficient documentation

## 2023-12-14 DIAGNOSIS — Z7984 Long term (current) use of oral hypoglycemic drugs: Secondary | ICD-10-CM | POA: Diagnosis not present

## 2023-12-14 DIAGNOSIS — Z7985 Long-term (current) use of injectable non-insulin antidiabetic drugs: Secondary | ICD-10-CM | POA: Diagnosis not present

## 2023-12-14 HISTORY — DX: Nausea with vomiting, unspecified: R11.2

## 2023-12-14 HISTORY — DX: Presence of cardiac pacemaker: Z95.0

## 2023-12-14 HISTORY — DX: Other complications of anesthesia, initial encounter: T88.59XA

## 2023-12-14 HISTORY — PX: SYNDESMOSIS REPAIR: SHX5182

## 2023-12-14 HISTORY — PX: ORIF ANKLE FRACTURE: SHX5408

## 2023-12-14 LAB — CUP PACEART REMOTE DEVICE CHECK
Battery Remaining Longevity: 112 mo
Battery Remaining Percentage: 95.5 %
Battery Voltage: 3.01 V
Brady Statistic AP VP Percent: 2.1 %
Brady Statistic AP VS Percent: 36 %
Brady Statistic AS VP Percent: 1 %
Brady Statistic AS VS Percent: 51 %
Brady Statistic RA Percent Paced: 19 %
Brady Statistic RV Percent Paced: 2.8 %
Date Time Interrogation Session: 20251014020017
Implantable Lead Connection Status: 753985
Implantable Lead Connection Status: 753985
Implantable Lead Implant Date: 20250228
Implantable Lead Implant Date: 20250228
Implantable Lead Location: 753859
Implantable Lead Location: 753860
Implantable Pulse Generator Implant Date: 20250228
Lead Channel Impedance Value: 430 Ohm
Lead Channel Impedance Value: 490 Ohm
Lead Channel Pacing Threshold Amplitude: 0.625 V
Lead Channel Pacing Threshold Amplitude: 1 V
Lead Channel Pacing Threshold Pulse Width: 0.5 ms
Lead Channel Pacing Threshold Pulse Width: 0.5 ms
Lead Channel Sensing Intrinsic Amplitude: 2.2 mV
Lead Channel Sensing Intrinsic Amplitude: 3.6 mV
Lead Channel Setting Pacing Amplitude: 1.625
Lead Channel Setting Pacing Amplitude: 2.5 V
Lead Channel Setting Pacing Pulse Width: 0.5 ms
Lead Channel Setting Sensing Sensitivity: 0.5 mV
Pulse Gen Model: 2272
Pulse Gen Serial Number: 8241895

## 2023-12-14 LAB — BASIC METABOLIC PANEL WITH GFR
Anion gap: 14 (ref 5–15)
BUN: 27 mg/dL — ABNORMAL HIGH (ref 8–23)
CO2: 18 mmol/L — ABNORMAL LOW (ref 22–32)
Calcium: 8.8 mg/dL — ABNORMAL LOW (ref 8.9–10.3)
Chloride: 104 mmol/L (ref 98–111)
Creatinine, Ser: 1.36 mg/dL — ABNORMAL HIGH (ref 0.44–1.00)
GFR, Estimated: 41 mL/min — ABNORMAL LOW (ref >60)
Glucose, Bld: 75 mg/dL (ref 70–99)
Potassium: 4.3 mmol/L (ref 3.5–5.1)
Sodium: 136 mmol/L (ref 135–145)

## 2023-12-14 LAB — CBC
HCT: 38.8 % (ref 36.0–46.0)
Hemoglobin: 12.2 g/dL (ref 12.0–15.0)
MCH: 26.5 pg (ref 26.0–34.0)
MCHC: 31.4 g/dL (ref 30.0–36.0)
MCV: 84.3 fL (ref 80.0–100.0)
Platelets: 209 K/uL (ref 150–400)
RBC: 4.6 MIL/uL (ref 3.87–5.11)
RDW: 16.3 % — ABNORMAL HIGH (ref 11.5–15.5)
WBC: 9.5 K/uL (ref 4.0–10.5)
nRBC: 0 % (ref 0.0–0.2)

## 2023-12-14 LAB — SURGICAL PCR SCREEN
MRSA, PCR: NEGATIVE
Staphylococcus aureus: NEGATIVE

## 2023-12-14 LAB — GLUCOSE, CAPILLARY
Glucose-Capillary: 86 mg/dL (ref 70–99)
Glucose-Capillary: 82 mg/dL (ref 70–99)
Glucose-Capillary: 88 mg/dL (ref 70–99)

## 2023-12-14 SURGERY — OPEN REDUCTION INTERNAL FIXATION (ORIF) ANKLE FRACTURE
Anesthesia: Regional | Site: Ankle | Laterality: Right

## 2023-12-14 MED ORDER — ASPIRIN 325 MG PO TABS
ORAL_TABLET | ORAL | 0 refills | Status: DC
  Filled 2023-12-14: qty 30, 30d supply, fill #0

## 2023-12-14 MED ORDER — POVIDONE-IODINE 7.5 % EX SOLN
Freq: Once | CUTANEOUS | Status: DC
  Filled 2023-12-14: qty 118

## 2023-12-14 MED ORDER — MIDAZOLAM HCL 2 MG/2ML IJ SOLN: 1.0000 mg | Freq: Once | INTRAMUSCULAR | Status: AC

## 2023-12-14 MED ORDER — OXYCODONE HCL 5 MG PO TABS
5.0000 mg | ORAL_TABLET | ORAL | 0 refills | Status: DC | PRN
  Filled 2023-12-14: qty 30, 5d supply, fill #0

## 2023-12-14 MED ORDER — CHLORHEXIDINE GLUCONATE 0.12 % MT SOLN
15.0000 mL | Freq: Once | OROMUCOSAL | Status: AC
  Administered 2023-12-14: 15 mL via OROMUCOSAL
  Filled 2023-12-14: qty 15

## 2023-12-14 MED ORDER — OXYCODONE HCL 5 MG/5ML PO SOLN: 5.0000 mg | Freq: Once | ORAL | Status: DC | PRN

## 2023-12-14 MED ORDER — LACTATED RINGERS IV SOLN: INTRAVENOUS | Status: DC

## 2023-12-14 MED ORDER — PROPOFOL 10 MG/ML IV BOLUS
INTRAVENOUS | Status: AC
Start: 2023-12-14 — End: 2023-12-14
  Filled 2023-12-14: qty 20

## 2023-12-14 MED ORDER — ONDANSETRON HCL 4 MG/2ML IJ SOLN
INTRAMUSCULAR | Status: AC
  Filled 2023-12-14: qty 2

## 2023-12-14 MED ORDER — BUPIVACAINE HCL (PF) 0.25 % IJ SOLN
INTRAMUSCULAR | Status: DC | PRN
  Administered 2023-12-14 (×2): 20 mL via PERINEURAL

## 2023-12-14 MED ORDER — FENTANYL CITRATE (PF) 100 MCG/2ML IJ SOLN: 25.0000 ug | INTRAMUSCULAR | Status: DC | PRN

## 2023-12-14 MED ORDER — OXYCODONE HCL 5 MG PO TABS: 5.0000 mg | ORAL_TABLET | Freq: Once | ORAL | Status: DC | PRN

## 2023-12-14 MED ORDER — ORAL CARE MOUTH RINSE: 15.0000 mL | Freq: Once | OROMUCOSAL | Status: AC

## 2023-12-14 MED ORDER — LIDOCAINE 2% (20 MG/ML) 5 ML SYRINGE
INTRAMUSCULAR | Status: DC | PRN
  Administered 2023-12-14: 60 mg via INTRAVENOUS

## 2023-12-14 MED ORDER — PROPOFOL 500 MG/50ML IV EMUL
INTRAVENOUS | Status: DC | PRN
  Administered 2023-12-14: 75 ug/kg/min via INTRAVENOUS

## 2023-12-14 MED ORDER — ONDANSETRON HCL 4 MG/2ML IJ SOLN
INTRAMUSCULAR | Status: DC | PRN
  Administered 2023-12-14: 4 mg via INTRAVENOUS

## 2023-12-14 MED ORDER — MIDAZOLAM HCL 2 MG/2ML IJ SOLN
INTRAMUSCULAR | Status: AC
  Administered 2023-12-14: 1 mg via INTRAVENOUS
  Filled 2023-12-14: qty 2

## 2023-12-14 MED ORDER — POVIDONE-IODINE 10 % EX SWAB
2.0000 | Freq: Once | CUTANEOUS | Status: AC
  Administered 2023-12-14: 2 via TOPICAL

## 2023-12-14 MED ORDER — DROPERIDOL 2.5 MG/ML IJ SOLN: 0.6250 mg | Freq: Once | INTRAMUSCULAR | Status: DC | PRN

## 2023-12-14 MED ORDER — ACETAMINOPHEN 10 MG/ML IV SOLN: 1000.0000 mg | Freq: Once | INTRAVENOUS | Status: DC | PRN

## 2023-12-14 MED ORDER — FENTANYL CITRATE (PF) 100 MCG/2ML IJ SOLN
INTRAMUSCULAR | Status: AC
  Filled 2023-12-14: qty 2

## 2023-12-14 MED ORDER — INSULIN ASPART 100 UNIT/ML IJ SOLN: 0.0000 [IU] | INTRAMUSCULAR | Status: DC | PRN

## 2023-12-14 MED ORDER — ACETAMINOPHEN 500 MG PO TABS
1000.0000 mg | ORAL_TABLET | Freq: Once | ORAL | Status: AC
  Administered 2023-12-14: 1000 mg via ORAL
  Filled 2023-12-14: qty 2

## 2023-12-14 MED ORDER — PROPOFOL 10 MG/ML IV BOLUS
INTRAVENOUS | Status: DC | PRN
  Administered 2023-12-14: 30 mg via INTRAVENOUS
  Administered 2023-12-14: 40 mg via INTRAVENOUS

## 2023-12-14 MED ORDER — FENTANYL CITRATE (PF) 100 MCG/2ML IJ SOLN
INTRAMUSCULAR | Status: DC | PRN
  Administered 2023-12-14 (×2): 25 ug via INTRAVENOUS

## 2023-12-14 MED ORDER — CEFAZOLIN SODIUM-DEXTROSE 2-4 GM/100ML-% IV SOLN
2.0000 g | INTRAVENOUS | Status: AC
  Administered 2023-12-14: 2 g via INTRAVENOUS
  Filled 2023-12-14: qty 100

## 2023-12-14 MED ORDER — LIDOCAINE 2% (20 MG/ML) 5 ML SYRINGE
INTRAMUSCULAR | Status: AC
  Filled 2023-12-14: qty 5

## 2023-12-14 SURGICAL SUPPLY — 55 items
ALCOHOL 70% 16 OZ (MISCELLANEOUS) ×2 IMPLANT
BAG COUNTER SPONGE SURGICOUNT (BAG) ×2 IMPLANT
BANDAGE ESMARK 6X9 LF (GAUZE/BANDAGES/DRESSINGS) IMPLANT
BIT DRILL 2 CANN GRADUATED (BIT) IMPLANT
BIT DRILL 2.5 CANN STRL (BIT) IMPLANT
BIT DRILL 2.6 CANN (BIT) IMPLANT
BIT DRILL 3 CANN ENDOSCOPIC (BIT) IMPLANT
BLADE SURG 15 STRL LF DISP TIS (BLADE) ×2 IMPLANT
BNDG COHESIVE 4X5 TAN STRL LF (GAUZE/BANDAGES/DRESSINGS) IMPLANT
BNDG COHESIVE 6X5 TAN ST LF (GAUZE/BANDAGES/DRESSINGS) IMPLANT
BNDG ELASTIC 6X10 VLCR STRL LF (GAUZE/BANDAGES/DRESSINGS) ×2 IMPLANT
CANISTER SUCTION 3000ML PPV (SUCTIONS) ×2 IMPLANT
CHLORAPREP W/TINT 26 (MISCELLANEOUS) ×4 IMPLANT
COVER SURGICAL LIGHT HANDLE (MISCELLANEOUS) ×2 IMPLANT
CUFF TOURN SGL QUICK 42 (TOURNIQUET CUFF) IMPLANT
CUFF TRNQT CYL 34X4.125X (TOURNIQUET CUFF) ×2 IMPLANT
DRAPE OEC MINIVIEW 54X84 (DRAPES) ×2 IMPLANT
DRAPE U-SHAPE 47X51 STRL (DRAPES) ×2 IMPLANT
DRSG MEPITEL 4X7.2 (GAUZE/BANDAGES/DRESSINGS) ×2 IMPLANT
DRSG XEROFORM 1X8 (GAUZE/BANDAGES/DRESSINGS) ×2 IMPLANT
ELECTRODE REM PT RTRN 9FT ADLT (ELECTROSURGICAL) ×2 IMPLANT
GAUZE PAD ABD 8X10 STRL (GAUZE/BANDAGES/DRESSINGS) ×4 IMPLANT
GAUZE SPONGE 4X4 12PLY STRL (GAUZE/BANDAGES/DRESSINGS) IMPLANT
GAUZE SPONGE 4X4 12PLY STRL LF (GAUZE/BANDAGES/DRESSINGS) ×2 IMPLANT
GLOVE BIOGEL M STRL SZ7.5 (GLOVE) ×2 IMPLANT
GLOVE BIOGEL PI IND STRL 8 (GLOVE) ×2 IMPLANT
GLOVE SRG 8 PF TXTR STRL LF DI (GLOVE) ×2 IMPLANT
GLOVE SURG ENC TEXT LTX SZ7.5 (GLOVE) ×2 IMPLANT
GOWN STRL REUS W/ TWL LRG LVL3 (GOWN DISPOSABLE) ×2 IMPLANT
GOWN STRL REUS W/ TWL XL LVL3 (GOWN DISPOSABLE) ×4 IMPLANT
GUIDEWIRE 1.35MM (WIRE) IMPLANT
KIT BASIN OR (CUSTOM PROCEDURE TRAY) ×2 IMPLANT
KIT TURNOVER KIT B (KITS) ×2 IMPLANT
PACK ORTHO EXTREMITY (CUSTOM PROCEDURE TRAY) ×2 IMPLANT
PAD ARMBOARD POSITIONER FOAM (MISCELLANEOUS) ×4 IMPLANT
PAD CAST 4YDX4 CTTN HI CHSV (CAST SUPPLIES) ×2 IMPLANT
PLATE LOCK DIST FIB RT 5H (Plate) IMPLANT
SCREW CORT 3.5X18 THRD (Screw) IMPLANT
SCREW LOCK COMP 3X16 (Screw) IMPLANT
SCREW LP CORT 3.0X20 (Screw) IMPLANT
SCREW LP TI 3.5X14MM (Screw) IMPLANT
SCREW NL FT KREULOCK 3.5X50 (Screw) IMPLANT
SCREW QCKFIX CANN 4.0X40MM (Screw) IMPLANT
SCREW VAL KREULOCK 3.0X12 TI (Screw) IMPLANT
SCREW VAL KREULOCK 3.0X14 TI (Screw) IMPLANT
SOLN 0.9% NACL 1000 ML (IV SOLUTION) ×2 IMPLANT
SOLN 0.9% NACL POUR BTL 1000ML (IV SOLUTION) ×2 IMPLANT
SPONGE T-LAP 18X18 ~~LOC~~+RFID (SPONGE) ×2 IMPLANT
SUCTION TUBE FRAZIER 10FR DISP (SUCTIONS) ×2 IMPLANT
SUT ETHILON 3 0 PS 1 (SUTURE) ×2 IMPLANT
SUT MNCRL AB 3-0 PS2 27 (SUTURE) ×2 IMPLANT
SUT VIC AB 2-0 CT1 TAPERPNT 27 (SUTURE) ×4 IMPLANT
TOWEL GREEN STERILE (TOWEL DISPOSABLE) ×2 IMPLANT
TOWEL GREEN STERILE FF (TOWEL DISPOSABLE) ×2 IMPLANT
TUBE CONNECTING 12X1/4 (SUCTIONS) ×2 IMPLANT

## 2023-12-14 NOTE — Transfer of Care (Signed)
 Immediate Anesthesia Transfer of Care Note  Patient: Molly Carpenter  Procedure(s) Performed: OPEN REDUCTION INTERNAL FIXATION (ORIF) ANKLE FRACTURE (Right: Ankle) REPAIR, SYNDESMOSIS, ANKLE (Right)  Patient Location: PACU  Anesthesia Type:MAC  Level of Consciousness: drowsy and patient cooperative  Airway & Oxygen Therapy: Patient Spontanous Breathing and Patient connected to face mask oxygen  Post-op Assessment: Report given to RN and Post -op Vital signs reviewed and unstable, Anesthesiologist notified  Post vital signs: Reviewed and stable  Last Vitals:  Vitals Value Taken Time  BP 125/69 12/14/23 11:40  Temp    Pulse 64 12/14/23 11:43  Resp 11 12/14/23 11:43  SpO2 99 % 12/14/23 11:43  Vitals shown include unfiled device data.  Last Pain:  Vitals:   12/14/23 0822  TempSrc:   PainSc: 2          Complications: No notable events documented.

## 2023-12-14 NOTE — Discharge Instructions (Signed)
 DR. [facility] POST-OP INSTRUCTIONS   Pain Management The numbing medicine and your leg will last around 18 hours, take a dose of your pain medicine as soon as you feel it wearing off to avoid rebound pain. Keep your foot elevated above heart level.  Make sure that your heel hangs free ('floats'). Take all prescribed medication as directed. If taking narcotic pain medication you may want to use an over-the-counter stool softener to avoid constipation. You may take over-the-counter NSAIDs (ibuprofen, naproxen, etc.) as well as over-the-counter acetaminophen  as directed on the packaging as a supplement for your pain and may also use it to wean away from the prescription medication.  Activity Non-weightbearing Keep splint intact  First Postoperative Visit Your first postop visit will be at least 2 weeks after surgery.  This should be scheduled when you schedule surgery. If you do not have a postoperative visit scheduled please call [phone] to schedule an appointment. At the appointment your incision will be evaluated for suture removal, x-rays will be obtained if necessary.  General Instructions Swelling is very common after foot and ankle surgery.  It often takes 3 months for the foot and ankle to begin to feel comfortable.  Some amount of swelling will persist for 6-12 months. DO NOT change the dressing.  If there is a problem with the dressing (too tight, loose, gets wet, etc.) please contact Dr. Isiah office. DO NOT get the dressing wet.  For showers you can use an over-the-counter cast cover or wrap a washcloth around the top of your dressing and then cover it with a plastic bag and tape it to your leg. DO NOT soak the incision (no tubs, pools, bath, etc.) until you have approval from Dr. ELSA.  Contact Dr. Nadara office or go to Emergency Room if: Temperature above 101 F. Increasing pain that is unresponsive to pain medication or elevation Excessive redness or  swelling in your foot Dressing problems - excessive bloody drainage, looseness or tightness, or if dressing gets wet Develop pain, swelling, warmth, or discoloration of your calf

## 2023-12-14 NOTE — Progress Notes (Signed)
 Remote PPM Transmission

## 2023-12-14 NOTE — Anesthesia Procedure Notes (Signed)
 Anesthesia Regional Block: Popliteal block   Pre-Anesthetic Checklist: , timeout performed,  Correct Patient, Correct Site, Correct Laterality,  Correct Procedure, Correct Position, site marked,  Risks and benefits discussed,  Surgical consent,  Pre-op evaluation,  At surgeon's request and post-op pain management  Laterality: Lower and Right  Prep: chloraprep       Needles:  Injection technique: Single-shot  Needle Type: Echogenic Needle     Needle Length: 9cm  Needle Gauge: 21     Additional Needles:   Procedures:,,,, ultrasound used (permanent image in chart),,    Narrative:  Start time: 12/14/2023 8:42 AM End time: 12/14/2023 8:45 AM Injection made incrementally with aspirations every 5 mL.  Performed by: Personally  Anesthesiologist: Boone Fess, MD  Additional Notes: Patient's chart reviewed and they were deemed appropriate candidate for procedure, at surgeon's request. Patient educated about risks, benefits, and alternatives of the block including but not limited to: temporary or permanent nerve damage, bleeding, infection, damage to surround tissues, block failure, local anesthetic toxicity. Patient expressed understanding. A formal time-out was conducted consistent with institution rules.  Monitors were applied, and minimal sedation used. The site was prepped with skin prep and allowed to dry, and sterile gloves were used. A high frequency linear ultrasound probe with probe cover was utilized throughout. Popliteal artery pulsatile and visualized in popliteal fossa along with adjacent sciatic nerve and its branch point, which appeared anatomically normal, local anesthetic injected around them just proximal to the branch point, and echogenic block needle trajectory was monitored throughout. Aspiration performed every 5ml. Blood vessels were avoided. All injections were performed without resistance and free of blood and paresthesias. The patient tolerated the procedure  well.  Injectate: 20ml 0.25% bupivacaine

## 2023-12-14 NOTE — H&P (Signed)
 PREOPERATIVE H&P  Chief Complaint: Right ankle pain  HPI: Molly Carpenter is a 73 y.o. female who presents for preoperative history and physical with a diagnosis of right trimalleolar ankle fracture dislocation that she sustained while on vacation.  They were able to do a closed reduction in the emergency department and splinted her.  She is here today for surgery. Symptoms are rated as moderate to severe, and have been worsening.  This is significantly impairing activities of daily living.  She has elected for surgical management.   Past Medical History:  Diagnosis Date   Chest tightness    Complication of anesthesia    Coronary artery disease    LHC 10/2000: dLAD 70-80% after a second septal perforator, EF 50%.  She was treated medically; Nuclear study 09/2010: EF 53%, fixed distal anterior, apical and septal defect most likely related to LBBB, no ischemia.    Diabetes mellitus    Hyperlipidemia    Hypertension    Hypotension    LBBB (left bundle branch block)    PONV (postoperative nausea and vomiting)    Presence of permanent cardiac pacemaker    Past Surgical History:  Procedure Laterality Date   ABDOMINAL HYSTERECTOMY     BIV PACEMAKER INSERTION CRT-P N/A 04/28/2023   Procedure: BIV PACEMAKER INSERTION CRT-P;  Surgeon: Inocencio Soyla Lunger, MD;  Location: [facility];  Service: Cardiovascular;  Laterality: N/A;   bladder tack     CARDIAC CATHETERIZATION  11/21/2000   EF 55%. sHOWED A 70-80% STENOSIS IN THE SECOND SEPTAL PERFORATOR BRANCH.   CARDIOVASCULAR STRESS TEST  03/30/2006   EF 50%   CESAREAN SECTION     x2   ROTATOR CUFF REPAIR Left    US  ECHOCARDIOGRAPHY  09/25/2008   EF 50%   Social History   Socioeconomic History   Marital status: Married    Spouse name: Not on file   Number of children: 2   Years of education: Not on file   Highest education level: Not on file  Occupational History   Occupation: Secondary school teacher    Comment: [employer]  Tobacco Use    Smoking status: Never   Smokeless tobacco: Never  Vaping Use   Vaping status: Never Used  Substance and Sexual Activity   Alcohol use: No    Alcohol/week: 0.0 standard drinks of alcohol   Drug use: No   Sexual activity: Not on file  Other Topics Concern   Not on file  Social History Narrative   Not on file   Social Drivers of Health   Financial Resource Strain: Not on file  Food Insecurity: Not on file  Transportation Needs: Not on file  Physical Activity: Not on file  Stress: Not on file  Social Connections: Not on file   Family History  Problem Relation Age of Onset   Heart attack Mother    Stroke Father    Diabetes Sister    Hypertension Sister    Diabetes Brother    Heart attack Brother    Diabetes Brother    Breast cancer Neg Hx    Allergies  Allergen Reactions   Promethazine-Codeine     Other Reaction(s): stomach upset   Semaglutide Nausea And Vomiting   Prior to Admission medications   Medication Sig Start Date End Date Taking? Authorizing Provider  aspirin  EC 81 MG tablet Take 1 tablet (81 mg total) by mouth daily. Swallow whole. Patient taking differently: Take 81 mg by mouth every evening. Swallow whole. 11/12/21  Yes Swaziland, Peter M, MD  carboxymethylcellulose (LUBRICANT EYE DROPS) 0.5 % SOLN Place 1-2 drops into both eyes 3 (three) times daily as needed (dry/irritated eyes.).   Yes [provider]  empagliflozin  (JARDIANCE ) 10 MG TABS tablet Take 1 tablet (10 mg total) by mouth daily. 05/29/23  Yes [place], Peter M, MD  HYDROcodone-acetaminophen  (NORCO/VICODIN) 5-325 MG tablet Take 2 tablets by mouth every 6 (six) hours as needed for moderate pain (pain score 4-6).   Yes [provider]  methocarbamol (ROBAXIN) 500 MG tablet Take 500 mg by mouth every 6 (six) hours as needed for muscle spasms.   Yes [provider]  metoprolol  succinate (TOPROL -XL) 25 MG 24 hr tablet Take 25 mg by mouth in the morning.   Yes [provider]   OZEMPIC, 0.25 OR 0.5 MG/DOSE, 2 MG/3ML SOPN Inject 0.25 mg into the skin once a week. 10/29/23  Yes [provider]  rosuvastatin  (CRESTOR ) 10 MG tablet TAKE 1 TABLET(10 MG) BY MOUTH DAILY Patient taking differently: Take 10 mg by mouth at bedtime. 10/24/23  Yes Sw[place], Peter M, MD  TRESIBA FLEXTOUCH 100 UNIT/ML FlexTouch Pen Inject 20 Units into the skin daily. 11/27/23  Yes [provider]  ACCU-CHEK GUIDE TEST test strip 1 each by Other route daily at 6 (six) AM. 04/16/23   [provider]  Blood Glucose Monitoring Suppl (ACCU-CHEK GUIDE) w/Device KIT     [provider]     Positive ROS: All other systems have been reviewed and were otherwise negative with the exception of those mentioned in the HPI and as above.  Physical Exam:  Vitals:   12/14/23 0701  BP: (!) 152/69  Pulse: 60  Resp: 17  Temp: 98.7 F (37.1 C)  SpO2: 97%   General: Alert, no acute distress Cardiovascular: No pedal edema Respiratory: No cyanosis, no use of accessory musculature GI: No organomegaly, abdomen is soft and non-tender Skin: No lesions in the area of chief complaint Neurologic: Sensation intact distally Psychiatric: Patient is competent for consent with normal mood and affect Lymphatic: No axillary or cervical lymphadenopathy  MUSCULOSKELETAL: Right ankle in a short leg splint.  Clinically is well aligned.  Toes exposed are warm and well-perfused with some swelling and ecchymosis but no tenderness.  No tenderness proximal to the splint.  Assessment: Right trimalleolar ankle fracture dislocation with likely syndesmosis disruption status post closed reduction in the emergency department   Plan: Plan for open treatment of her trimalleolar ankle fracture likely without posterior fixation and possible fixation of the syndesmosis.  Postoperatively she will be placed in a splint and be discharged home.  We discussed the risks, benefits and alternatives of surgery which  include but are not limited to wound healing complications, infection, nonunion, malunion, need for further surgery, damage to surrounding structures and continued pain.  They understand there is no guarantees to an acceptable outcome.  After weighing these risks they opted to proceed with surgery.     Lonni JONELLE Pae, MD    12/14/2023 7:36 AM

## 2023-12-14 NOTE — Anesthesia Procedure Notes (Signed)
 Anesthesia Regional Block: Adductor canal block   Pre-Anesthetic Checklist: , timeout performed,  Correct Patient, Correct Site, Correct Laterality,  Correct Procedure, Correct Position, site marked,  Risks and benefits discussed,  Surgical consent,  Pre-op evaluation,  At surgeon's request and post-op pain management  Laterality: Lower and Right  Prep: chloraprep       Needles:  Injection technique: Single-shot  Needle Type: Echogenic Needle     Needle Length: 9cm  Needle Gauge: 21     Additional Needles:   Procedures:,,,, ultrasound used (permanent image in chart),,    Narrative:  Start time: 12/14/2023 8:45 AM End time: 12/14/2023 8:48 AM Injection made incrementally with aspirations every 5 mL.  Performed by: Personally  Anesthesiologist: Boone Fess, MD  Additional Notes: Patient's chart reviewed and they were deemed appropriate candidate for procedure, per surgeon's request. Patient educated about risks, benefits, and alternatives of the block including but not limited to: temporary or permanent nerve damage, bleeding, infection, damage to surround tissues, block failure, local anesthetic toxicity. Patient expressed understanding. A formal time-out was conducted consistent with institution rules.  Monitors were applied, and minimal sedation used (see nursing record). The site was prepped with skin prep and allowed to dry, and sterile gloves were used. A high frequency linear ultrasound probe with probe cover was utilized throughout. Femoral artery visualized at mid-thigh level, local anesthetic injected anterolateral to it, and echogenic block needle trajectory was monitored throughout. Hydrodissection of saphenous nerve visualized and appeared anatomically normal. Aspiration performed every 5ml. Blood vessels were avoided. All injections were performed without resistance and free of blood and paresthesias. The patient tolerated the procedure well.  Injectate: 20ml 0.25%  bupivacaine

## 2023-12-14 NOTE — Anesthesia Postprocedure Evaluation (Signed)
 Anesthesia Post Note  Patient: Molly Carpenter  Procedure(s) Performed: OPEN REDUCTION INTERNAL FIXATION (ORIF) ANKLE FRACTURE (Right: Ankle) REPAIR, SYNDESMOSIS, ANKLE (Right)     Patient location during evaluation: PACU Anesthesia Type: Regional Level of consciousness: awake and alert Pain management: pain level controlled Vital Signs Assessment: post-procedure vital signs reviewed and stable Respiratory status: spontaneous breathing, nonlabored ventilation, respiratory function stable and patient connected to nasal cannula oxygen Cardiovascular status: blood pressure returned to baseline and stable Postop Assessment: no apparent nausea or vomiting Anesthetic complications: no   No notable events documented.  Last Vitals:  Vitals:   12/14/23 1145 12/14/23 1200  BP: (!) 122/57 138/64  Pulse: 68 63  Resp: 14 14  Temp:    SpO2: 98% 98%    Last Pain:  Vitals:   12/14/23 1200  TempSrc:   PainSc: 0-No pain        RLE Motor Response: Purposeful movement;Responds to commands (12/14/23 1200) RLE Sensation: Decreased (12/14/23 1200)      Molly Carpenter

## 2023-12-15 ENCOUNTER — Encounter (HOSPITAL_COMMUNITY): Payer: Self-pay | Admitting: Orthopaedic Surgery

## 2023-12-18 NOTE — Op Note (Signed)
 Molly  D Carpenter female 73 y.o. 12/14/2023  PreOperative Diagnosis: Right trimalleolar ankle fracture Right syndesmosis disruption  PostOperative Diagnosis: Same  PROCEDURE: Open treatment internal fixation of right trimalleolar ankle fracture without posterior fixation Open treatment syndesmosis Ankle stress view fluoroscopy  SURGEON: Lonni Pae, MD  ASSISTANT: Jesse [place], PA-C was necessary for patient positioning, prep, drape assistance with fracture reduction, placement of hardware and closure  ANESTHESIA: General With peripheral nerve block  FINDINGS: See below  IMPLANTS: Arthrex distal fibular locking plate and fully threaded cortical screw  INDICATIONS:73 y.o. female had a fall and sustained the above injury.  She was indicated for surgery due to the nature of her fracture.  She did have a dislocation that was reduced in the emergency department.   Patient understood the risks, benefits and alternatives to surgery which include but are not limited to wound healing complications, infection, nonunion, malunion, need for further surgery as well as damage to surrounding structures. They also understood the potential for continued pain in that there were no guarantees of acceptable outcome After weighing these risks the patient opted to proceed with surgery.  PROCEDURE: Patient was identified in the preoperative holding area.  The right ankle was marked by myself.  Consent was signed by myself and the patient.  Block was performed by anesthesia in the preoperative holding area.  Patient was taken to the operative suite and placed supine on the operative table.  MAC anesthesia was induced without difficulty. Bump was placed under the operative hip and bone foam was used.  All bony prominences were well padded.  Tourniquet was placed on the operative thigh.  Preoperative antibiotics were given. The extremity was prepped and draped in the usual sterile fashion and  surgical timeout was performed.  The limb was elevated and the tourniquet was inflated to 250 mmHg.  We began by making a longitudinal incision overlying the fibula.  Was taken sharply through skin and subcutaneous tissue.  Blunt dissection was used to mobilize skin flaps and care was taken to protect any branches of the superficial peroneal nerve which were not identified in the surgical field.  We then were able to gain access to the fracture site and was fully mobilized.  Interposed soft tissue and fracture hematoma was removed with a curette and rongeurs.  The fracture was mobilized and reduced.  It was then reduced under direct visualization help additionally with a lobster claw.  Once acceptable reduction was confirmed fluoroscopically a lag screw was placed by technique.  Then a distal fibular locking plate was placed across the fracture to stabilize it.  We then turned our attention to the medial malleolus.  An incision was made overlying the medial malleolus fracture.  Was taken sharply through skin and subcutaneous tissue.  Blunt dissection was used to mobilize skin flaps.  We are able to mobilize the fracture and remove any interposed hematoma tissue and fibrous tissue.  The fracture was comminuted posteriorly.  Were able to reduce the fracture under direct visualization also provisionally with a pointed reduction forceps.  Then fluoroscopy confirmed acceptable reduction.  A partially-threaded cannulated screw was placed across the fracture to stabilize it.  The fracture size along the anterior colliculus was only amenable for single screw fixation.  Fluoroscopy confirmed acceptable reduction of the fracture.  We then proceeded with ankle stress view fluoroscopy.  The ankle was stressed fluoroscopically and found to be unstable with regard to syndesmosis widening and medial clear space widening.  Decision was made to proceed  with open treatment of the syndesmosis.  And a separate deep incision was  created anteriorly about the distal aspect of the fibula near the level of the syndesmosis.  We are able to mobilize the syndesmosis and remove interposed tissue from the area with rongeurs and a curette.  Then the syndesmosis was reduced under direct visualization and held visually with a Weber clamp.  Then a single fully threaded screw was placed across the syndesmosis to stabilize it.  We got Quadra cortical fixation.  Then the syndesmosis was stressed again and found to be stable.  The ankle mortise was acceptably reduced on x-rays.  Final fluoroscopic images were obtained.  The wounds were irrigated and closed in a layered fashion using 2-0 Vicryl, 3-0 Monocryl and 3-0 nylon suture.  Soft dressing was placed.  Tourniquet was released.  Short leg splint was placed.  She was awakened from anesthesia and taken recovery in stable condition.  No complications.    POST OPERATIVE INSTRUCTIONS: Nonweightbearing to operative extremity Keep splint dry and intact Aspirin  for DVT prophylaxis Follow-up in 2 weeks for splint removal, suture removal if appropriate nonweightbearing x-rays of the ankle.  TOURNIQUET TIME: Less than 2 hours  BLOOD LOSS:  Minimal         DRAINS: none         SPECIMEN: none       COMPLICATIONS:  * No complications entered in OR log *         Disposition: PACU - hemodynamically stable.         Condition: stable

## 2023-12-20 ENCOUNTER — Ambulatory Visit: Payer: Self-pay | Admitting: Cardiology

## 2023-12-25 DIAGNOSIS — E782 Mixed hyperlipidemia: Secondary | ICD-10-CM | POA: Diagnosis not present

## 2023-12-25 DIAGNOSIS — E1122 Type 2 diabetes mellitus with diabetic chronic kidney disease: Secondary | ICD-10-CM | POA: Diagnosis not present

## 2023-12-25 DIAGNOSIS — Z0000 Encounter for general adult medical examination without abnormal findings: Secondary | ICD-10-CM | POA: Diagnosis not present

## 2023-12-25 DIAGNOSIS — I10 Essential (primary) hypertension: Secondary | ICD-10-CM | POA: Diagnosis not present

## 2024-01-16 ENCOUNTER — Other Ambulatory Visit: Payer: Self-pay

## 2024-01-16 DIAGNOSIS — Z1231 Encounter for screening mammogram for malignant neoplasm of breast: Secondary | ICD-10-CM

## 2024-01-29 DIAGNOSIS — M25672 Stiffness of left ankle, not elsewhere classified: Secondary | ICD-10-CM | POA: Insufficient documentation

## 2024-01-29 DIAGNOSIS — M6281 Muscle weakness (generalized): Secondary | ICD-10-CM | POA: Insufficient documentation

## 2024-01-29 DIAGNOSIS — R262 Difficulty in walking, not elsewhere classified: Secondary | ICD-10-CM | POA: Insufficient documentation

## 2024-02-05 ENCOUNTER — Ambulatory Visit (HOSPITAL_COMMUNITY): Admission: RE | Admit: 2024-02-05 | Discharge: 2024-02-05 | Attending: Cardiology | Admitting: Cardiology

## 2024-02-05 DIAGNOSIS — I5022 Chronic systolic (congestive) heart failure: Secondary | ICD-10-CM

## 2024-02-05 LAB — ECHOCARDIOGRAM COMPLETE
Area-P 1/2: 3.08 cm2
S' Lateral: 3.6 cm

## 2024-02-05 MED ORDER — PERFLUTREN LIPID MICROSPHERE
1.0000 mL | INTRAVENOUS | Status: AC | PRN
  Administered 2024-02-05: 1 mL via INTRAVENOUS

## 2024-02-06 ENCOUNTER — Ambulatory Visit: Payer: Self-pay | Admitting: Cardiology

## 2024-02-12 ENCOUNTER — Other Ambulatory Visit (HOSPITAL_COMMUNITY)

## 2024-02-12 NOTE — H&P (View-Only) (Signed)
 Cardiology Office Note:    Date:  02/14/2024   ID:  Molly  D Carpenter, DOB Aug 31, 1950, MRN [id number]  PCP:  Corlis Pagan, NP  [facility] Cardiologist:  Shinita Mac, MD  [facility] Electrophysiologist:  Will Gladis Norton, MD   Referring MD: Corlis Pagan, NP   Chief Complaint  Patient presents with   Congestive Heart Failure     History of Present Illness:    Molly Carpenter is a 73 y.o. female with a hx of CAD, HTN, HLD, DM II and LBBB.  Patient had a cardiac catheterization in 2002 that showed moderate disease in the septal perforator branch.  Myoview  in 2012 showed fixed anterior apical and septal defect likely related to left bundle branch block, no ischemia was noted, EF 53%. Patient was on 05/22/2019 at which time she complained of more shortness of breath on exertion after she started exercising.  Echocardiogram performed on 06/04/2019 showed EF 40 to 45%, grade 2 DD.  Subsequent Myoview  obtained on 07/12/2019 showed EF 40%, medium defect present in the apical anterior, apical septal, and apex location consistent with left bundle branch artifact, no evidence of ischemia.  Given LV dysfunction, carvedilol  was added to her telmisartan  for heart failure medication titration. She later developed hypotension and telmisartan  dose was reduced. She was started on Jardiance .   In October 2022 we repeated an Echo that showed no change. EF 35-40%. We added aldactone  at that time. Repeat lab in November  showed increase in creatinine so aldactone  was stopped.  She was having  symptoms of lightheadedness and feeling out of focus.  We had her wear an event monitor which did show frequent PVCs. Recommended switching from Coreg  to Toprol  XL for better suppression of PVCs.   In July 2023   had worsening renal function with GFR down to 16. Her ASA, Crestor , telmisartan  and metformin were all stopped. She was referred to Nephrology. ARB stopped. Renal duplex was normal. Subsequent renal  function improved back to her baseline with creatinine of 1.2. she sees Dr Tommas for her DM.   Echo in Jan showed EF remained at 30-35%. Seen by EP. Underwent dual chamber pacer implant. Unable to place CS lead. On follow up noted to have PVC burden of 10%.   Recent Echo showed further decline in EF to 25-30%.   On follow up today she notes she is doing well. Denies any SOB, chest pain, palpitations or dizziness. No edema. Weight is down 15 lbs since starting Ozempic. Sugars are much better as well with A1c from 12 to 7%. She did fall and fractured her right ankle and had surgery. Now in a boot.    Past Medical History:  Diagnosis Date   Chest tightness    Complication of anesthesia    Coronary artery disease    LHC 10/2000: dLAD 70-80% after a second septal perforator, EF 50%.  She was treated medically; Nuclear study 09/2010: EF 53%, fixed distal anterior, apical and septal defect most likely related to LBBB, no ischemia.    Diabetes mellitus    Hyperlipidemia    Hypertension    Hypotension    LBBB (left bundle branch block)    PONV (postoperative nausea and vomiting)    Presence of permanent cardiac pacemaker     Past Surgical History:  Procedure Laterality Date   ABDOMINAL HYSTERECTOMY     BIV PACEMAKER INSERTION CRT-P N/A 04/28/2023   Procedure: BIV PACEMAKER INSERTION CRT-P;  Surgeon: Norton Soyla Gladis, MD;  [facility];  Service: Cardiovascular;  Laterality: N/A;   bladder tack     CARDIAC CATHETERIZATION  11/21/2000   EF 55%. sHOWED A 70-80% STENOSIS IN THE SECOND SEPTAL PERFORATOR BRANCH.   CARDIOVASCULAR STRESS TEST  03/30/2006   EF 50%   CESAREAN SECTION     x2   ORIF ANKLE FRACTURE Right 12/14/2023   Procedure: OPEN REDUCTION INTERNAL FIXATION (ORIF) ANKLE FRACTURE;  Surgeon: Elsa Lonni SAUNDERS, MD;  Location: [facility] OR;  Service: Orthopedics;  Laterality: Right;   ROTATOR CUFF REPAIR Left    SYNDESMOSIS REPAIR Right 12/14/2023   Procedure: REPAIR,  SYNDESMOSIS, ANKLE;  Surgeon: Elsa Lonni SAUNDERS, MD;  Location: [facility] OR;  Service: Orthopedics;  Laterality: Right;   US  ECHOCARDIOGRAPHY  09/25/2008   EF 50%    Current Medications: Current Meds  Medication Sig   ACCU-CHEK GUIDE TEST test strip 1 each by Other route daily at 6 (six) AM.   aspirin  (BAYER ASPIRIN ) 325 MG tablet Take 1 tablet by mouth for 30 DAYS for blood clot prevention. Start post op day 1. May resume baseline 81mg  aspirin  thereafter   aspirin  EC 81 MG tablet Take 1 tablet (81 mg total) by mouth daily. Swallow whole. (Patient taking differently: Take 81 mg by mouth every evening. Swallow whole.)   Blood Glucose Monitoring Suppl (ACCU-CHEK GUIDE) w/Device KIT    carboxymethylcellulose (LUBRICANT EYE DROPS) 0.5 % SOLN Place 1-2 drops into both eyes 3 (three) times daily as needed (dry/irritated eyes.).   empagliflozin  (JARDIANCE ) 10 MG TABS tablet Take 1 tablet (10 mg total) by mouth daily.   HYDROcodone-acetaminophen  (NORCO/VICODIN) 5-325 MG tablet Take 2 tablets by mouth every 6 (six) hours as needed for moderate pain (pain score 4-6).   methocarbamol (ROBAXIN) 500 MG tablet Take 500 mg by mouth every 6 (six) hours as needed for muscle spasms.   metoprolol  succinate (TOPROL -XL) 25 MG 24 hr tablet Take 25 mg by mouth in the morning.   oxyCODONE  (ROXICODONE ) 5 MG immediate release tablet Take 1 tablet (5 mg total) by mouth every 4 (four) hours as needed.   OZEMPIC, 0.25 OR 0.5 MG/DOSE, 2 MG/3ML SOPN Inject 0.25 mg into the skin once a week.   rosuvastatin  (CRESTOR ) 10 MG tablet TAKE 1 TABLET(10 MG) BY MOUTH DAILY (Patient taking differently: Take 10 mg by mouth at bedtime.)   TRESIBA FLEXTOUCH 100 UNIT/ML FlexTouch Pen Inject 20 Units into the skin daily.     Allergies:   Promethazine-codeine and Semaglutide   Social History   Socioeconomic History   Marital status: Married    Spouse name: Not on file   Number of children: 2   Years of education: Not on file   Highest  education level: Not on file  Occupational History   Occupation: secondary school teacher    Comment: [employer]  Tobacco Use   Smoking status: Never   Smokeless tobacco: Never  Vaping Use   Vaping status: Never Used  Substance and Sexual Activity   Alcohol use: No    Alcohol/week: 0.0 standard drinks of alcohol   Drug use: No   Sexual activity: Not on file  Other Topics Concern   Not on file  Social History Narrative   Not on file   Social Drivers of Health   Tobacco Use: Low Risk (02/14/2024)   Patient History    Smoking Tobacco Use: Never    Smokeless Tobacco Use: Never    Passive Exposure: Not on file  Financial Resource Strain: Not on  file  Food Insecurity: Not on file  Transportation Needs: Not on file  Physical Activity: Not on file  Stress: Not on file  Social Connections: Not on file  Depression (EYV7-0): Not on file  Alcohol Screen: Not on file  Housing: Not on file  Utilities: Not on file  Health Literacy: Not on file     Family History: The patient's family history includes Diabetes in her brother, brother, and sister; Heart attack in her brother and mother; Hypertension in her sister; Stroke in her father. There is no history of Breast cancer.  ROS:   Please see the history of present illness.     All other systems reviewed and are negative.  EKGs/Labs/Other Studies Reviewed:    The following studies were reviewed today:  Echo 06/04/2019  1. Left ventricular ejection fraction, by estimation, is 40 to 45%. The  left ventricle has mildly decreased function. The left ventricle  demonstrates global hypokinesis. There is mild concentric left ventricular  hypertrophy. Left ventricular diastolic  parameters are consistent with Grade II diastolic dysfunction  (pseudonormalization). Elevated left atrial pressure.   2. Right ventricular systolic function is normal. The right ventricular  size is normal. Tricuspid regurgitation signal is inadequate for assessing  PA pressure.    3. The mitral valve is grossly normal. No evidence of mitral valve  regurgitation. No evidence of mitral stenosis.   4. The aortic valve is grossly normal. Aortic valve regurgitation is not  visualized. No aortic stenosis is present.   5. The inferior vena cava is normal in size with greater than 50%  respiratory variability, suggesting right atrial pressure of 3 mmHg.   Myoview  07/12/2019 The left ventricular ejection fraction is moderately decreased (30-44%). Nuclear stress EF: 40%. There was no ST segment deviation noted during stress. Defect 1: There is a medium defect present in the apical anterior, apical septal and apex location consistent with LBBB artifact and unchanged from prior. This is an intermediate risk study due to reduced systolic function. There is no evidence of ischemia.  Echo 12/16/20: IMPRESSIONS     1. Left ventricular ejection fraction, by estimation, is 35 to 40%. The  left ventricle has moderately decreased function. The left ventricle has  no regional wall motion abnormalities. Left ventricular diastolic  parameters are consistent with Grade I  diastolic dysfunction (impaired relaxation).   2. Right ventricular systolic function is normal. The right ventricular  size is normal. There is normal pulmonary artery systolic pressure. The  estimated right ventricular systolic pressure is 13.0 mmHg.   3. The mitral valve is normal in structure. Mild mitral valve  regurgitation. No evidence of mitral stenosis.   4. The aortic valve is normal in structure. Aortic valve regurgitation is  not visualized. No aortic stenosis is present.   5. The inferior vena cava is normal in size with greater than 50%  respiratory variability, suggesting right atrial pressure of 3 mmHg.   Comparison(s): No significant change from prior study. Prior images  reviewed side by side.   Event monitor 07/09/21: Study Highlights    Normal sinus rhythm Occasional PACs. one 6 beat run  of SVT Frequent PVCs with burden 12.8%. some bigeminy and trigeminy     Patch Wear Time:  14 days and 0 hours (2023-04-22T17:39:14-0400 to 2023-05-06T17:39:18-0400)   Patient had a min HR of 50 bpm, max HR of 138 bpm, and avg HR of 68 bpm. Predominant underlying rhythm was Sinus Rhythm. Bundle Branch Block/IVCD was present. QRS morphology  changes were present throughout recording. 1 run of Supraventricular Tachycardia  occurred lasting 6 beats with a max rate of 138 bpm (avg 125 bpm). Isolated SVEs were occasional (1.4%, 19579), SVE Couplets were rare (<1.0%, 1377), and SVE Triplets were rare (<1.0%, 26). Isolated VEs were frequent (12.8%, [id number]), VE Couplets were  rare (<1.0%, 6592), and VE Triplets were rare (<1.0%, 916). Ventricular Bigeminy and Trigeminy were present.      Echo 02/05/24: IMPRESSIONS     1. Left ventricular ejection fraction, by estimation, is 25 to 30%. The  left ventricle has severely decreased function. The left ventricle has no  regional wall motion abnormalities. Left ventricular diastolic parameters  are consistent with Grade II  diastolic dysfunction (pseudonormalization).   2. Right ventricular systolic function is normal. The right ventricular  size is normal. Tricuspid regurgitation signal is inadequate for assessing  PA pressure.   3. The mitral valve is normal in structure. Trivial mitral valve  regurgitation. No evidence of mitral stenosis.   4. The aortic valve is normal in structure. Aortic valve regurgitation is  not visualized. No aortic stenosis is present.   5. The inferior vena cava is normal in size with greater than 50%  respiratory variability, suggesting right atrial pressure of 3 mmHg.     Recent Labs: 12/14/2023: BUN 27; Creatinine, Ser 1.36; Hemoglobin 12.2; Platelets 209; Potassium 4.3; Sodium 136  Recent Lipid Panel No results found for: CHOL, TRIG, HDL, CHOLHDL, VLDL, LDLCALC, LDLDIRECT   Dated 05/08/19: cholesterol  150, triglycerides 202, HDL 42, LDL 48.,  Dated 08/12/19: A1c 8%. Creatinine 1.11. otherwise CMET normal.  Dated 12/30/19: cholesterol 115, triglycerides 155, HDL 42, LDL 46. Creatinine 1.16. otherwise CMET normal Dated 03/31/20: A1c 7.1%.  Dated 07/01/20: A1c 7.5%. cholesterol  117, triglycerides 151, HDL 43, LDL 48. Creatinine 1.23. otherwise CMET normal.  Dated 01/11/21: cholesterol 111, triglycerides 174, HDL 41, LDL 42 Dated 05/10/21: A1c 8%. Creatinine 1.23. otherwise CMET normal. Dated 08/30/21: A1c 8.6%.  Dated 10/05/21: creatinine 2.7. BUN 37. Hgb 11.1. otherwise CMET, CBC, Magnesium normal. Dated 11/02/21: normal CBC and chemistries Dated 12/01/21: A1c 5.6%.  Dated 04/04/22: cholesterol 109, HDL 37, LDL 45 Dated 06/06/22: triglycerides 117  Physical Exam:    VS:  BP 139/62   Pulse (!) 53   Ht 5' 3 (1.6 m)   Wt 136 lb 9.6 oz (62 kg)   SpO2 96%   BMI 24.20 kg/m     Wt Readings from Last 3 Encounters:  02/14/24 136 lb 9.6 oz (62 kg)  12/14/23 146 lb (66.2 kg)  08/16/23 151 lb 8 oz (68.7 kg)     GEN:  Well nourished, well developed in no acute distress HEENT: Normal NECK: No JVD; No carotid bruits LYMPHATICS: No lymphadenopathy CARDIAC: RRR, no murmurs, rubs, gallops RESPIRATORY:  Clear to auscultation without rales, wheezing or rhonchi  ABDOMEN: Soft, non-tender, non-distended MUSCULOSKELETAL:  No edema; No deformity  SKIN: Warm and dry NEUROLOGIC:  Alert and oriented x 3 PSYCHIATRIC:  Normal affect   ASSESSMENT:    1. Chronic systolic CHF (congestive heart failure) (HCC)   2. Pre-procedure lab exam   3. Primary hypertension   4. PVC's (premature ventricular contractions)   5. LBBB (left bundle branch block)   6. History of cardiac pacemaker in situ           PLAN:    In order of problems listed above:  1.   Chronic systolic CHF EF 35-40%. Class 1-2 symptoms. Unable to tolerate  ARB or aldactone  due to renal effects. She is on Toprol  XL and Jardiance  at 10 mg daily.   She is  Euvolemic.  Underwent dual chamber pacer insertion in Feb 2025. Unfortunately unable to place CS lead. Repeat Echo shows some worsening of EF to 25-30%. Fortunately she is not very symptomatic. I think we need to pursue [facility] to make sure her cardiomyopathy is not ischemic and to look at hemodynamics. Interestingly cardiac cath in 2002 described diffuse 70-80% mid to distal LAD disease but Myoview  5 years ago was normal. The procedure and risks were reviewed including but not limited to death, myocardial infarction, stroke, arrythmias, bleeding, transfusion, emergency surgery, dye allergy, or renal dysfunction. The patient voices understanding and is agreeable to proceed. Will schedule cath on Jan 2. Post cath will arrange follow up with EP to see if she would benefit from Left bundle or epicardial pacing to restore synchrony.   2. CAD: Class 1 angina. Continue ASA 81 mg daily and Crestor  10 mg daily. Follow up Cath  3. Hypertension: currently well controlled.   4. Hyperlipidemia: on Crestor . Labs per PCP  5. DM2: Managed by Dr Tommas. On insulin  and Jardiance . Improved on Ozempic.   6. Left bundle branch block: Chronic  7. PVCs. Burden of 10% on pacer check- similar to baseline. Asymptomatic currently. Continue Metoprolol     Medication Adjustments/Labs and Tests Ordered: Current medicines are reviewed at length with the patient today.  Concerns regarding medicines are outlined above.  Orders Placed This Encounter  Procedures   Basic metabolic panel with GFR   CBC w/Diff    No orders of the defined types were placed in this encounter.    Patient Instructions  Medication Instructions:  Continue same medications *If you need a refill on your cardiac medications before your next appointment, please call your pharmacy*  Lab Work: None ordered  Testing/Procedures: Cardiac cath scheduled at [facility] Friday 1/2  Arrive at 5:30 am  Follow instructions below  Follow-Up: At  [facility], you and your health needs are our priority.  As part of our continuing mission to provide you with exceptional heart care, our providers are all part of one team.  This team includes your primary Cardiologist (physician) and Advanced Practice Providers or APPs (Physician Assistants and Nurse Practitioners) who all work together to provide you with the care you need, when you need it.  Your next appointment:  After Cath    Provider:  Dr.Laynie Espy           Schedule appointment with [facility]       [facility] A DEPT OF [facility] AT [address] A DEPT OF THE [place] [address] [place] [address] Dept: [phone] Loc: [phone]  Aryan  D Jefferys  02/14/2024  You are scheduled for a Cardiac Catheterization on Friday, January 2 with Dr. Cuyler Vandyken.  1. Please arrive at the [facility] (Main Entrance A) at [facility]: [address] [place], [place] [address] at 5:30 AM (This time is 2 hour(s) before your procedure to ensure your preparation).   Free valet parking service is available. You will check in at ADMITTING. The support person will be asked to wait in the waiting room.  It is OK to have someone drop you off and come back when you are ready to be discharged.    Special note: Every effort is made to have your procedure done on  time. Please understand that emergencies sometimes delay scheduled procedures.  2. Diet: Nothing to eat after midnight.   3. Hydration: You need to be well hydrated before your procedure. On January 2, you may drink approved liquids (see below) until 2 hours before the procedure, with 16 oz of water as your last intake.   List of approved liquids water, clear juice, clear tea, black coffee, fruit juices, non-citric and without pulp, carbonated beverages, Gatorade, Kool -Aid, plain Jello-O and plain ice popsicles.  4. Labs: You will need to have blood  drawn on ,Monday 12/29 You do not need to be fasting.  5. Medication instructions in preparation for your procedure:   Hold Ozempic 7 days before Start holding Sat 12/27   Hold Jardiance  3 days before Start holding Mon 12/29   Take 1/2 dose of Insulin  night before cath    Hold morning dose of insulin       On the morning of your procedure, take your Aspirin  81 mg and any morning medicines NOT listed above.  You may use sips of water.  6. Plan to go home the same day, you will only stay overnight if medically necessary. 7. Bring a current list of your medications and current insurance cards. 8. You MUST have a responsible person to drive you home. 9. Someone MUST be with you the first 24 hours after you arrive home or your discharge will be delayed. 10. Please wear clothes that are easy to get on and off and wear slip-on shoes.  Thank you for allowing us  to care for you!   -- [facility]   We recommend signing up for the patient portal called MyChart.  Sign up information is provided on this After Visit Summary.  MyChart is used to connect with patients for Virtual Visits (Telemedicine).  Patients are able to view lab/test results, encounter notes, upcoming appointments, etc.  Non-urgent messages can be sent to your provider as well.   To learn more about what you can do with MyChart, go to [email or web address].     Follow up with me in 6 months  Signed, Durwood Dittus, MD  02/14/2024 2:40 PM    [facility]

## 2024-02-12 NOTE — Progress Notes (Unsigned)
 Cardiology Office Note:    Date:  02/14/2024   ID:  Molly  D Carpenter, DOB Aug 31, 1950, MRN [id number]  PCP:  Corlis Pagan, NP  [facility] Cardiologist:  Shinita Mac, MD  [facility] Electrophysiologist:  Will Gladis Norton, MD   Referring MD: Corlis Pagan, NP   Chief Complaint  Patient presents with   Congestive Heart Failure     History of Present Illness:    Molly  D Carpenter is a 73 y.o. female with a hx of CAD, HTN, HLD, DM II and LBBB.  Patient had a cardiac catheterization in 2002 that showed moderate disease in the septal perforator branch.  Myoview  in 2012 showed fixed anterior apical and septal defect likely related to left bundle branch block, no ischemia was noted, EF 53%. Patient was on 05/22/2019 at which time she complained of more shortness of breath on exertion after she started exercising.  Echocardiogram performed on 06/04/2019 showed EF 40 to 45%, grade 2 DD.  Subsequent Myoview  obtained on 07/12/2019 showed EF 40%, medium defect present in the apical anterior, apical septal, and apex location consistent with left bundle branch artifact, no evidence of ischemia.  Given LV dysfunction, carvedilol  was added to her telmisartan  for heart failure medication titration. She later developed hypotension and telmisartan  dose was reduced. She was started on Jardiance .   In October 2022 we repeated an Echo that showed no change. EF 35-40%. We added aldactone  at that time. Repeat lab in November  showed increase in creatinine so aldactone  was stopped.  She was having  symptoms of lightheadedness and feeling out of focus.  We had her wear an event monitor which did show frequent PVCs. Recommended switching from Coreg  to Toprol  XL for better suppression of PVCs.   In July 2023   had worsening renal function with GFR down to 16. Her ASA, Crestor , telmisartan  and metformin were all stopped. She was referred to Nephrology. ARB stopped. Renal duplex was normal. Subsequent renal  function improved back to her baseline with creatinine of 1.2. she sees Dr Tommas for her DM.   Echo in Jan showed EF remained at 30-35%. Seen by EP. Underwent dual chamber pacer implant. Unable to place CS lead. On follow up noted to have PVC burden of 10%.   Recent Echo showed further decline in EF to 25-30%.   On follow up today she notes she is doing well. Denies any SOB, chest pain, palpitations or dizziness. No edema. Weight is down 15 lbs since starting Ozempic. Sugars are much better as well with A1c from 12 to 7%. She did fall and fractured her right ankle and had surgery. Now in a boot.    Past Medical History:  Diagnosis Date   Chest tightness    Complication of anesthesia    Coronary artery disease    LHC 10/2000: dLAD 70-80% after a second septal perforator, EF 50%.  She was treated medically; Nuclear study 09/2010: EF 53%, fixed distal anterior, apical and septal defect most likely related to LBBB, no ischemia.    Diabetes mellitus    Hyperlipidemia    Hypertension    Hypotension    LBBB (left bundle branch block)    PONV (postoperative nausea and vomiting)    Presence of permanent cardiac pacemaker     Past Surgical History:  Procedure Laterality Date   ABDOMINAL HYSTERECTOMY     BIV PACEMAKER INSERTION CRT-P N/A 04/28/2023   Procedure: BIV PACEMAKER INSERTION CRT-P;  Surgeon: Norton Soyla Gladis, MD;  [facility];  Service: Cardiovascular;  Laterality: N/A;   bladder tack     CARDIAC CATHETERIZATION  11/21/2000   EF 55%. sHOWED A 70-80% STENOSIS IN THE SECOND SEPTAL PERFORATOR BRANCH.   CARDIOVASCULAR STRESS TEST  03/30/2006   EF 50%   CESAREAN SECTION     x2   ORIF ANKLE FRACTURE Right 12/14/2023   Procedure: OPEN REDUCTION INTERNAL FIXATION (ORIF) ANKLE FRACTURE;  Surgeon: Elsa Lonni SAUNDERS, MD;  Location: [facility] OR;  Service: Orthopedics;  Laterality: Right;   ROTATOR CUFF REPAIR Left    SYNDESMOSIS REPAIR Right 12/14/2023   Procedure: REPAIR,  SYNDESMOSIS, ANKLE;  Surgeon: Elsa Lonni SAUNDERS, MD;  Location: [facility] OR;  Service: Orthopedics;  Laterality: Right;   US  ECHOCARDIOGRAPHY  09/25/2008   EF 50%    Current Medications: Current Meds  Medication Sig   ACCU-CHEK GUIDE TEST test strip 1 each by Other route daily at 6 (six) AM.   aspirin  (BAYER ASPIRIN ) 325 MG tablet Take 1 tablet by mouth for 30 DAYS for blood clot prevention. Start post op day 1. May resume baseline 81mg  aspirin  thereafter   aspirin  EC 81 MG tablet Take 1 tablet (81 mg total) by mouth daily. Swallow whole. (Patient taking differently: Take 81 mg by mouth every evening. Swallow whole.)   Blood Glucose Monitoring Suppl (ACCU-CHEK GUIDE) w/Device KIT    carboxymethylcellulose (LUBRICANT EYE DROPS) 0.5 % SOLN Place 1-2 drops into both eyes 3 (three) times daily as needed (dry/irritated eyes.).   empagliflozin  (JARDIANCE ) 10 MG TABS tablet Take 1 tablet (10 mg total) by mouth daily.   HYDROcodone-acetaminophen  (NORCO/VICODIN) 5-325 MG tablet Take 2 tablets by mouth every 6 (six) hours as needed for moderate pain (pain score 4-6).   methocarbamol (ROBAXIN) 500 MG tablet Take 500 mg by mouth every 6 (six) hours as needed for muscle spasms.   metoprolol  succinate (TOPROL -XL) 25 MG 24 hr tablet Take 25 mg by mouth in the morning.   oxyCODONE  (ROXICODONE ) 5 MG immediate release tablet Take 1 tablet (5 mg total) by mouth every 4 (four) hours as needed.   OZEMPIC, 0.25 OR 0.5 MG/DOSE, 2 MG/3ML SOPN Inject 0.25 mg into the skin once a week.   rosuvastatin  (CRESTOR ) 10 MG tablet TAKE 1 TABLET(10 MG) BY MOUTH DAILY (Patient taking differently: Take 10 mg by mouth at bedtime.)   TRESIBA FLEXTOUCH 100 UNIT/ML FlexTouch Pen Inject 20 Units into the skin daily.     Allergies:   Promethazine-codeine and Semaglutide   Social History   Socioeconomic History   Marital status: Married    Spouse name: Not on file   Number of children: 2   Years of education: Not on file   Highest  education level: Not on file  Occupational History   Occupation: secondary school teacher    Comment: [employer]  Tobacco Use   Smoking status: Never   Smokeless tobacco: Never  Vaping Use   Vaping status: Never Used  Substance and Sexual Activity   Alcohol use: No    Alcohol/week: 0.0 standard drinks of alcohol   Drug use: No   Sexual activity: Not on file  Other Topics Concern   Not on file  Social History Narrative   Not on file   Social Drivers of Health   Tobacco Use: Low Risk (02/14/2024)   Patient History    Smoking Tobacco Use: Never    Smokeless Tobacco Use: Never    Passive Exposure: Not on file  Financial Resource Strain: Not on  file  Food Insecurity: Not on file  Transportation Needs: Not on file  Physical Activity: Not on file  Stress: Not on file  Social Connections: Not on file  Depression (EYV7-0): Not on file  Alcohol Screen: Not on file  Housing: Not on file  Utilities: Not on file  Health Literacy: Not on file     Family History: The patient's family history includes Diabetes in her brother, brother, and sister; Heart attack in her brother and mother; Hypertension in her sister; Stroke in her father. There is no history of Breast cancer.  ROS:   Please see the history of present illness.     All other systems reviewed and are negative.  EKGs/Labs/Other Studies Reviewed:    The following studies were reviewed today:  Echo 06/04/2019  1. Left ventricular ejection fraction, by estimation, is 40 to 45%. The  left ventricle has mildly decreased function. The left ventricle  demonstrates global hypokinesis. There is mild concentric left ventricular  hypertrophy. Left ventricular diastolic  parameters are consistent with Grade II diastolic dysfunction  (pseudonormalization). Elevated left atrial pressure.   2. Right ventricular systolic function is normal. The right ventricular  size is normal. Tricuspid regurgitation signal is inadequate for assessing  PA pressure.    3. The mitral valve is grossly normal. No evidence of mitral valve  regurgitation. No evidence of mitral stenosis.   4. The aortic valve is grossly normal. Aortic valve regurgitation is not  visualized. No aortic stenosis is present.   5. The inferior vena cava is normal in size with greater than 50%  respiratory variability, suggesting right atrial pressure of 3 mmHg.   Myoview  07/12/2019 The left ventricular ejection fraction is moderately decreased (30-44%). Nuclear stress EF: 40%. There was no ST segment deviation noted during stress. Defect 1: There is a medium defect present in the apical anterior, apical septal and apex location consistent with LBBB artifact and unchanged from prior. This is an intermediate risk study due to reduced systolic function. There is no evidence of ischemia.  Echo 12/16/20: IMPRESSIONS     1. Left ventricular ejection fraction, by estimation, is 35 to 40%. The  left ventricle has moderately decreased function. The left ventricle has  no regional wall motion abnormalities. Left ventricular diastolic  parameters are consistent with Grade I  diastolic dysfunction (impaired relaxation).   2. Right ventricular systolic function is normal. The right ventricular  size is normal. There is normal pulmonary artery systolic pressure. The  estimated right ventricular systolic pressure is 13.0 mmHg.   3. The mitral valve is normal in structure. Mild mitral valve  regurgitation. No evidence of mitral stenosis.   4. The aortic valve is normal in structure. Aortic valve regurgitation is  not visualized. No aortic stenosis is present.   5. The inferior vena cava is normal in size with greater than 50%  respiratory variability, suggesting right atrial pressure of 3 mmHg.   Comparison(s): No significant change from prior study. Prior images  reviewed side by side.   Event monitor 07/09/21: Study Highlights    Normal sinus rhythm Occasional PACs. one 6 beat run  of SVT Frequent PVCs with burden 12.8%. some bigeminy and trigeminy     Patch Wear Time:  14 days and 0 hours (2023-04-22T17:39:14-0400 to 2023-05-06T17:39:18-0400)   Patient had a min HR of 50 bpm, max HR of 138 bpm, and avg HR of 68 bpm. Predominant underlying rhythm was Sinus Rhythm. Bundle Branch Block/IVCD was present. QRS morphology  changes were present throughout recording. 1 run of Supraventricular Tachycardia  occurred lasting 6 beats with a max rate of 138 bpm (avg 125 bpm). Isolated SVEs were occasional (1.4%, 19579), SVE Couplets were rare (<1.0%, 1377), and SVE Triplets were rare (<1.0%, 26). Isolated VEs were frequent (12.8%, [id number]), VE Couplets were  rare (<1.0%, 6592), and VE Triplets were rare (<1.0%, 916). Ventricular Bigeminy and Trigeminy were present.      Echo 02/05/24: IMPRESSIONS     1. Left ventricular ejection fraction, by estimation, is 25 to 30%. The  left ventricle has severely decreased function. The left ventricle has no  regional wall motion abnormalities. Left ventricular diastolic parameters  are consistent with Grade II  diastolic dysfunction (pseudonormalization).   2. Right ventricular systolic function is normal. The right ventricular  size is normal. Tricuspid regurgitation signal is inadequate for assessing  PA pressure.   3. The mitral valve is normal in structure. Trivial mitral valve  regurgitation. No evidence of mitral stenosis.   4. The aortic valve is normal in structure. Aortic valve regurgitation is  not visualized. No aortic stenosis is present.   5. The inferior vena cava is normal in size with greater than 50%  respiratory variability, suggesting right atrial pressure of 3 mmHg.     Recent Labs: 12/14/2023: BUN 27; Creatinine, Ser 1.36; Hemoglobin 12.2; Platelets 209; Potassium 4.3; Sodium 136  Recent Lipid Panel No results found for: CHOL, TRIG, HDL, CHOLHDL, VLDL, LDLCALC, LDLDIRECT   Dated 05/08/19: cholesterol  150, triglycerides 202, HDL 42, LDL 48.,  Dated 08/12/19: A1c 8%. Creatinine 1.11. otherwise CMET normal.  Dated 12/30/19: cholesterol 115, triglycerides 155, HDL 42, LDL 46. Creatinine 1.16. otherwise CMET normal Dated 03/31/20: A1c 7.1%.  Dated 07/01/20: A1c 7.5%. cholesterol  117, triglycerides 151, HDL 43, LDL 48. Creatinine 1.23. otherwise CMET normal.  Dated 01/11/21: cholesterol 111, triglycerides 174, HDL 41, LDL 42 Dated 05/10/21: A1c 8%. Creatinine 1.23. otherwise CMET normal. Dated 08/30/21: A1c 8.6%.  Dated 10/05/21: creatinine 2.7. BUN 37. Hgb 11.1. otherwise CMET, CBC, Magnesium normal. Dated 11/02/21: normal CBC and chemistries Dated 12/01/21: A1c 5.6%.  Dated 04/04/22: cholesterol 109, HDL 37, LDL 45 Dated 06/06/22: triglycerides 117  Physical Exam:    VS:  BP 139/62   Pulse (!) 53   Ht 5' 3 (1.6 m)   Wt 136 lb 9.6 oz (62 kg)   SpO2 96%   BMI 24.20 kg/m     Wt Readings from Last 3 Encounters:  02/14/24 136 lb 9.6 oz (62 kg)  12/14/23 146 lb (66.2 kg)  08/16/23 151 lb 8 oz (68.7 kg)     GEN:  Well nourished, well developed in no acute distress HEENT: Normal NECK: No JVD; No carotid bruits LYMPHATICS: No lymphadenopathy CARDIAC: RRR, no murmurs, rubs, gallops RESPIRATORY:  Clear to auscultation without rales, wheezing or rhonchi  ABDOMEN: Soft, non-tender, non-distended MUSCULOSKELETAL:  No edema; No deformity  SKIN: Warm and dry NEUROLOGIC:  Alert and oriented x 3 PSYCHIATRIC:  Normal affect   ASSESSMENT:    1. Chronic systolic CHF (congestive heart failure) (HCC)   2. Pre-procedure lab exam   3. Primary hypertension   4. PVC's (premature ventricular contractions)   5. LBBB (left bundle branch block)   6. History of cardiac pacemaker in situ           PLAN:    In order of problems listed above:  1.   Chronic systolic CHF EF 35-40%. Class 1-2 symptoms. Unable to tolerate  ARB or aldactone  due to renal effects. She is on Toprol  XL and Jardiance  at 10 mg daily.   She is  Euvolemic.  Underwent dual chamber pacer insertion in Feb 2025. Unfortunately unable to place CS lead. Repeat Echo shows some worsening of EF to 25-30%. Fortunately she is not very symptomatic. I think we need to pursue [facility] to make sure her cardiomyopathy is not ischemic and to look at hemodynamics. Interestingly cardiac cath in 2002 described diffuse 70-80% mid to distal LAD disease but Myoview  5 years ago was normal. The procedure and risks were reviewed including but not limited to death, myocardial infarction, stroke, arrythmias, bleeding, transfusion, emergency surgery, dye allergy, or renal dysfunction. The patient voices understanding and is agreeable to proceed. Will schedule cath on Jan 2. Post cath will arrange follow up with EP to see if she would benefit from Left bundle or epicardial pacing to restore synchrony.   2. CAD: Class 1 angina. Continue ASA 81 mg daily and Crestor  10 mg daily. Follow up Cath  3. Hypertension: currently well controlled.   4. Hyperlipidemia: on Crestor . Labs per PCP  5. DM2: Managed by Dr Tommas. On insulin  and Jardiance . Improved on Ozempic.   6. Left bundle branch block: Chronic  7. PVCs. Burden of 10% on pacer check- similar to baseline. Asymptomatic currently. Continue Metoprolol     Medication Adjustments/Labs and Tests Ordered: Current medicines are reviewed at length with the patient today.  Concerns regarding medicines are outlined above.  Orders Placed This Encounter  Procedures   Basic metabolic panel with GFR   CBC w/Diff    No orders of the defined types were placed in this encounter.    Patient Instructions  Medication Instructions:  Continue same medications *If you need a refill on your cardiac medications before your next appointment, please call your pharmacy*  Lab Work: None ordered  Testing/Procedures: Cardiac cath scheduled at [facility] Friday 1/2  Arrive at 5:30 am  Follow instructions below  Follow-Up: At  [facility], you and your health needs are our priority.  As part of our continuing mission to provide you with exceptional heart care, our providers are all part of one team.  This team includes your primary Cardiologist (physician) and Advanced Practice Providers or APPs (Physician Assistants and Nurse Practitioners) who all work together to provide you with the care you need, when you need it.  Your next appointment:  After Cath    Provider:  Dr.Laynie Espy           Schedule appointment with [facility]       [place] [facility] A DEPT OF [place] HEARTCARE AT [address] A DEPT OF THE [place] [address] [place] [address] Dept: [phone] Loc: [phone]  Molly Carpenter  02/14/2024  You are scheduled for a Cardiac Catheterization on Friday, January 2 with Dr. Cuyler Vandyken.  1. Please arrive at the [facility] (Main Entrance A) at [facility]: [address] [place], [place] [address] at 5:30 AM (This time is 2 hour(s) before your procedure to ensure your preparation).   Free valet parking service is available. You will check in at ADMITTING. The support person will be asked to wait in the waiting room.  It is OK to have someone drop you off and come back when you are ready to be discharged.    Special note: Every effort is made to have your procedure done on  time. Please understand that emergencies sometimes delay scheduled procedures.  2. Diet: Nothing to eat after midnight.   3. Hydration: You need to be well hydrated before your procedure. On January 2, you may drink approved liquids (see below) until 2 hours before the procedure, with 16 oz of water as your last intake.   List of approved liquids water, clear juice, clear tea, black coffee, fruit juices, non-citric and without pulp, carbonated beverages, Gatorade, Kool -Aid, plain Jello-O and plain ice popsicles.  4. Labs: You will need to have blood  drawn on ,Monday 12/29 You do not need to be fasting.  5. Medication instructions in preparation for your procedure:   Hold Ozempic 7 days before Start holding Sat 12/27   Hold Jardiance  3 days before Start holding Mon 12/29   Take 1/2 dose of Insulin  night before cath    Hold morning dose of insulin       On the morning of your procedure, take your Aspirin  81 mg and any morning medicines NOT listed above.  You may use sips of water.  6. Plan to go home the same day, you will only stay overnight if medically necessary. 7. Bring a current list of your medications and current insurance cards. 8. You MUST have a responsible person to drive you home. 9. Someone MUST be with you the first 24 hours after you arrive home or your discharge will be delayed. 10. Please wear clothes that are easy to get on and off and wear slip-on shoes.  Thank you for allowing us  to care for you!   -- [place] [facility]   We recommend signing up for the patient portal called MyChart.  Sign up information is provided on this After Visit Summary.  MyChart is used to connect with patients for Virtual Visits (Telemedicine).  Patients are able to view lab/test results, encounter notes, upcoming appointments, etc.  Non-urgent messages can be sent to your provider as well.   To learn more about what you can do with MyChart, go to [email or web address].     Follow up with me in 6 months  Signed, Durwood Dittus, MD  02/14/2024 2:40 PM    [facility]

## 2024-02-13 ENCOUNTER — Ambulatory Visit: Admission: RE | Admit: 2024-02-13 | Discharge: 2024-02-13 | Disposition: A | Source: Ambulatory Visit

## 2024-02-13 DIAGNOSIS — Z1231 Encounter for screening mammogram for malignant neoplasm of breast: Secondary | ICD-10-CM

## 2024-02-14 ENCOUNTER — Ambulatory Visit: Attending: Cardiovascular Disease | Admitting: Cardiology

## 2024-02-14 ENCOUNTER — Other Ambulatory Visit: Payer: Self-pay | Admitting: Cardiology

## 2024-02-14 ENCOUNTER — Encounter: Payer: Self-pay | Admitting: Cardiology

## 2024-02-14 VITALS — BP 139/62 | HR 53 | Ht 63.0 in | Wt 136.6 lb

## 2024-02-14 DIAGNOSIS — Z01812 Encounter for preprocedural laboratory examination: Secondary | ICD-10-CM

## 2024-02-14 DIAGNOSIS — Z950 Presence of cardiac pacemaker: Secondary | ICD-10-CM

## 2024-02-14 DIAGNOSIS — I5022 Chronic systolic (congestive) heart failure: Secondary | ICD-10-CM | POA: Diagnosis not present

## 2024-02-14 DIAGNOSIS — I447 Left bundle-branch block, unspecified: Secondary | ICD-10-CM | POA: Diagnosis not present

## 2024-02-14 DIAGNOSIS — I493 Ventricular premature depolarization: Secondary | ICD-10-CM

## 2024-02-14 DIAGNOSIS — I10 Essential (primary) hypertension: Secondary | ICD-10-CM | POA: Diagnosis not present

## 2024-02-14 NOTE — Patient Instructions (Addendum)
 Medication Instructions:  Continue same medications *If you need a refill on your cardiac medications before your next appointment, please call your pharmacy*  Lab Work: None ordered  Testing/Procedures: Cardiac cath scheduled at [facility] Friday 1/2  Arrive at 5:30 am  Follow instructions below  Follow-Up: At [facility], you and your health needs are our priority.  As part of our continuing mission to provide you with exceptional heart care, our providers are all part of one team.  This team includes your primary Cardiologist (physician) and Advanced Practice Providers or APPs (Physician Assistants and Nurse Practitioners) who all work together to provide you with the care you need, when you need it.  Your next appointment:  After Cath    Provider:  Dr.Jordan           Schedule appointment with [facility]       [place] [facility] A DEPT OF [place] HEARTCARE AT [address] A DEPT OF THE [place] [address] [place] [address] Dept: [phone] Loc: [phone]  Ressie  D Rochford  02/14/2024  You are scheduled for a Cardiac Catheterization on Friday, January 2 with Dr. Peter Jordan.  1. Please arrive at the [facility] (Main Entrance A) at [facility]: [address] [place], [place] [address] at 5:30 AM (This time is 2 hour(s) before your procedure to ensure your preparation).   Free valet parking service is available. You will check in at ADMITTING. The support person will be asked to wait in the waiting room.  It is OK to have someone drop you off and come back when you are ready to be discharged.    Special note: Every effort is made to have your procedure done on time. Please understand that emergencies sometimes delay scheduled procedures.  2. Diet: Nothing to eat after midnight.   3. Hydration: You need to be well hydrated before your procedure. On January 2, you may drink approved liquids  (see below) until 2 hours before the procedure, with 16 oz of water as your last intake.   List of approved liquids water, clear juice, clear tea, black coffee, fruit juices, non-citric and without pulp, carbonated beverages, Gatorade, Kool -Aid, plain Jello-O and plain ice popsicles.  4. Labs: You will need to have blood drawn on ,Monday 12/29 You do not need to be fasting.  5. Medication instructions in preparation for your procedure:   Hold Ozempic 7 days before Start holding Sat 12/27   Hold Jardiance  3 days before Start holding Mon 12/29   Take 1/2 dose of Insulin  night before cath    Hold morning dose of insulin       On the morning of your procedure, take your Aspirin  81 mg and any morning medicines NOT listed above.  You may use sips of water.  6. Plan to go home the same day, you will only stay overnight if medically necessary. 7. Bring a current list of your medications and current insurance cards. 8. You MUST have a responsible person to drive you home. 9. Someone MUST be with you the first 24 hours after you arrive home or your discharge will be delayed. 10. Please wear clothes that are easy to get on and off and wear slip-on shoes.  Thank you for allowing us  to care for you!   -- [place] [facility]   We recommend signing up for the patient portal called MyChart.  Sign up  information is provided on this After Visit Summary.  MyChart is used to connect with patients for Virtual Visits (Telemedicine).  Patients are able to view lab/test results, encounter notes, upcoming appointments, etc.  Non-urgent messages can be sent to your provider as well.   To learn more about what you can do with MyChart, go to [email or web address].

## 2024-02-26 LAB — CBC WITH DIFFERENTIAL/PLATELET

## 2024-02-27 ENCOUNTER — Ambulatory Visit: Payer: Self-pay | Admitting: Cardiology

## 2024-02-27 LAB — BASIC METABOLIC PANEL WITH GFR
BUN/Creatinine Ratio: 13 (ref 12–28)
BUN: 19 mg/dL (ref 8–27)
CO2: 21 mmol/L (ref 20–29)
Calcium: 9.3 mg/dL (ref 8.7–10.3)
Chloride: 105 mmol/L (ref 96–106)
Creatinine, Ser: 1.52 mg/dL — ABNORMAL HIGH (ref 0.57–1.00)
Glucose: 94 mg/dL (ref 70–99)
Potassium: 4.4 mmol/L (ref 3.5–5.2)
Sodium: 143 mmol/L (ref 134–144)
eGFR: 36 mL/min/1.73 — ABNORMAL LOW

## 2024-02-27 LAB — CBC WITH DIFFERENTIAL/PLATELET
Basos: 1 %
EOS (ABSOLUTE): 0.1 x10E3/uL (ref 0.0–0.2)
Eos: 1 %
Hematocrit: 43.3 % (ref 34.0–46.6)
Hemoglobin: 13.8 g/dL (ref 11.1–15.9)
Immature Granulocytes: 0 %
Immature Granulocytes: 0 x10E3/uL (ref 0.0–0.1)
Lymphs: 26 %
MCH: 27 pg (ref 26.6–33.0)
MCHC: 31.9 g/dL (ref 31.5–35.7)
MCV: 85 fL (ref 79–97)
Monocytes Absolute: 0.1 x10E3/uL (ref 0.0–0.4)
Monocytes Absolute: 0.8 x10E3/uL (ref 0.1–0.9)
Monocytes: 9 %
Neutrophils Absolute: 2.3 x10E3/uL (ref 0.7–3.1)
Neutrophils Absolute: 5.5 x10E3/uL (ref 1.4–7.0)
Neutrophils: 63 %
Platelets: 221 x10E3/uL (ref 150–450)
RBC: 5.11 x10E6/uL (ref 3.77–5.28)
RDW: 15.6 % — AB (ref 11.7–15.4)
WBC: 8.7 x10E3/uL (ref 3.4–10.8)

## 2024-03-01 ENCOUNTER — Ambulatory Visit (HOSPITAL_COMMUNITY)
Admission: RE | Admit: 2024-03-01 | Discharge: 2024-03-01 | Disposition: A | Discharge status: Home / Self Care | Attending: Cardiology | Admitting: Cardiology

## 2024-03-01 ENCOUNTER — Other Ambulatory Visit: Payer: Self-pay

## 2024-03-01 ENCOUNTER — Encounter (HOSPITAL_COMMUNITY)
Admission: RE | Disposition: A | Discharge status: Home / Self Care | Payer: Self-pay | Source: Home / Self Care | Attending: Cardiology

## 2024-03-01 DIAGNOSIS — Z7984 Long term (current) use of oral hypoglycemic drugs: Secondary | ICD-10-CM | POA: Insufficient documentation

## 2024-03-01 DIAGNOSIS — Z7982 Long term (current) use of aspirin: Secondary | ICD-10-CM | POA: Diagnosis not present

## 2024-03-01 DIAGNOSIS — I2510 Atherosclerotic heart disease of native coronary artery without angina pectoris: Secondary | ICD-10-CM | POA: Diagnosis not present

## 2024-03-01 DIAGNOSIS — E119 Type 2 diabetes mellitus without complications: Secondary | ICD-10-CM | POA: Diagnosis not present

## 2024-03-01 DIAGNOSIS — Z950 Presence of cardiac pacemaker: Secondary | ICD-10-CM | POA: Diagnosis not present

## 2024-03-01 DIAGNOSIS — E785 Hyperlipidemia, unspecified: Secondary | ICD-10-CM | POA: Diagnosis not present

## 2024-03-01 DIAGNOSIS — Z79899 Other long term (current) drug therapy: Secondary | ICD-10-CM | POA: Diagnosis not present

## 2024-03-01 DIAGNOSIS — I447 Left bundle-branch block, unspecified: Secondary | ICD-10-CM | POA: Diagnosis not present

## 2024-03-01 DIAGNOSIS — I110 Hypertensive heart disease with heart failure: Secondary | ICD-10-CM | POA: Diagnosis not present

## 2024-03-01 DIAGNOSIS — I2584 Coronary atherosclerosis due to calcified coronary lesion: Secondary | ICD-10-CM | POA: Diagnosis not present

## 2024-03-01 DIAGNOSIS — I493 Ventricular premature depolarization: Secondary | ICD-10-CM | POA: Insufficient documentation

## 2024-03-01 DIAGNOSIS — Z7985 Long-term (current) use of injectable non-insulin antidiabetic drugs: Secondary | ICD-10-CM | POA: Insufficient documentation

## 2024-03-01 DIAGNOSIS — I25119 Atherosclerotic heart disease of native coronary artery with unspecified angina pectoris: Secondary | ICD-10-CM | POA: Diagnosis not present

## 2024-03-01 DIAGNOSIS — Z794 Long term (current) use of insulin: Secondary | ICD-10-CM | POA: Diagnosis not present

## 2024-03-01 DIAGNOSIS — I5022 Chronic systolic (congestive) heart failure: Secondary | ICD-10-CM

## 2024-03-01 HISTORY — PX: RIGHT/LEFT HEART CATH AND CORONARY ANGIOGRAPHY: CATH118266

## 2024-03-01 LAB — POCT I-STAT 7, (LYTES, BLD GAS, ICA,H+H)
Acid-base deficit: 4 mmol/L — ABNORMAL HIGH (ref 0.0–2.0)
Bicarbonate: 20.6 mmol/L (ref 20.0–28.0)
Calcium, Ion: 1.14 mmol/L — ABNORMAL LOW (ref 1.15–1.40)
HCT: 37 % (ref 36.0–46.0)
Hemoglobin: 12.6 g/dL (ref 12.0–15.0)
O2 Saturation: 95 %
Potassium: 3.8 mmol/L (ref 3.5–5.1)
Sodium: 143 mmol/L (ref 135–145)
TCO2: 22 mmol/L (ref 22–32)
pCO2 arterial: 33.6 mmHg (ref 32–48)
pH, Arterial: 7.396 (ref 7.35–7.45)
pO2, Arterial: 73 mmHg — ABNORMAL LOW (ref 83–108)

## 2024-03-01 LAB — POCT I-STAT EG7
Acid-base deficit: 2 mmol/L (ref 0.0–2.0)
Acid-base deficit: 3 mmol/L — ABNORMAL HIGH (ref 0.0–2.0)
Bicarbonate: 22.9 mmol/L (ref 20.0–28.0)
Bicarbonate: 23.4 mmol/L (ref 20.0–28.0)
Calcium, Ion: 1.2 mmol/L (ref 1.15–1.40)
Calcium, Ion: 1.2 mmol/L (ref 1.15–1.40)
HCT: 38 % (ref 36.0–46.0)
HCT: 38 % (ref 36.0–46.0)
Hemoglobin: 12.9 g/dL (ref 12.0–15.0)
Hemoglobin: 12.9 g/dL (ref 12.0–15.0)
O2 Saturation: 63 %
O2 Saturation: 65 %
Potassium: 3.9 mmol/L (ref 3.5–5.1)
Potassium: 3.9 mmol/L (ref 3.5–5.1)
Sodium: 142 mmol/L (ref 135–145)
Sodium: 142 mmol/L (ref 135–145)
TCO2: 24 mmol/L (ref 22–32)
TCO2: 25 mmol/L (ref 22–32)
pCO2, Ven: 41.8 mmHg — ABNORMAL LOW (ref 44–60)
pCO2, Ven: 42.6 mmHg — ABNORMAL LOW (ref 44–60)
pH, Ven: 7.346 (ref 7.25–7.43)
pH, Ven: 7.348 (ref 7.25–7.43)
pO2, Ven: 35 mmHg (ref 32–45)
pO2, Ven: 36 mmHg (ref 32–45)

## 2024-03-01 LAB — GLUCOSE, CAPILLARY: Glucose-Capillary: 125 mg/dL — ABNORMAL HIGH (ref 70–99)

## 2024-03-01 MED ORDER — ASPIRIN 81 MG PO CHEW: 81.0000 mg | CHEWABLE_TABLET | ORAL | Status: DC

## 2024-03-01 MED ORDER — SODIUM CHLORIDE 0.9% FLUSH: 3.0000 mL | Freq: Two times a day (BID) | INTRAVENOUS | Status: DC

## 2024-03-01 MED ORDER — FENTANYL CITRATE (PF) 100 MCG/2ML IJ SOLN
INTRAMUSCULAR | Status: AC
  Filled 2024-03-01: qty 2

## 2024-03-01 MED ORDER — LIDOCAINE HCL (PF) 1 % IJ SOLN
INTRAMUSCULAR | Status: AC
  Filled 2024-03-01: qty 30

## 2024-03-01 MED ORDER — MIDAZOLAM HCL 2 MG/2ML IJ SOLN
INTRAMUSCULAR | Status: AC
  Filled 2024-03-01: qty 2

## 2024-03-01 MED ORDER — HEPARIN (PORCINE) IN NACL 1000-0.9 UT/500ML-% IV SOLN
INTRAVENOUS | Status: DC | PRN
  Administered 2024-03-01: 1000 mL

## 2024-03-01 MED ORDER — FENTANYL CITRATE (PF) 100 MCG/2ML IJ SOLN
INTRAMUSCULAR | Status: DC | PRN
  Administered 2024-03-01: 25 ug via INTRAVENOUS

## 2024-03-01 MED ORDER — FREE WATER: 500.0000 mL | Freq: Once | Status: DC

## 2024-03-01 MED ORDER — SODIUM CHLORIDE 0.9% FLUSH: 3.0000 mL | INTRAVENOUS | Status: DC | PRN

## 2024-03-01 MED ORDER — VERAPAMIL HCL 2.5 MG/ML IV SOLN
INTRAVENOUS | Status: AC
  Filled 2024-03-01: qty 2

## 2024-03-01 MED ORDER — HEPARIN SODIUM (PORCINE) 1000 UNIT/ML IJ SOLN
INTRAMUSCULAR | Status: AC
  Filled 2024-03-01: qty 10

## 2024-03-01 MED ORDER — LIDOCAINE HCL (PF) 1 % IJ SOLN
INTRAMUSCULAR | Status: DC | PRN
  Administered 2024-03-01: 5 mL via INTRADERMAL

## 2024-03-01 MED ORDER — VERAPAMIL HCL 2.5 MG/ML IV SOLN
INTRAVENOUS | Status: DC | PRN
  Administered 2024-03-01: 10 mL via INTRA_ARTERIAL

## 2024-03-01 MED ORDER — IOHEXOL 350 MG/ML SOLN
INTRAVENOUS | Status: DC | PRN
  Administered 2024-03-01: 40 mL

## 2024-03-01 MED ORDER — MIDAZOLAM HCL (PF) 2 MG/2ML IJ SOLN
INTRAMUSCULAR | Status: DC | PRN
  Administered 2024-03-01: 1 mg via INTRAVENOUS

## 2024-03-01 MED ORDER — SODIUM CHLORIDE 0.9 % IV SOLN: 250.0000 mL | INTRAVENOUS | Status: DC | PRN

## 2024-03-01 MED ORDER — HEPARIN SODIUM (PORCINE) 1000 UNIT/ML IJ SOLN
INTRAMUSCULAR | Status: DC | PRN
  Administered 2024-03-01: 3000 [IU] via INTRAVENOUS

## 2024-03-01 NOTE — Progress Notes (Signed)
 Discharge instructions reviewed with patiet and husband Luis at the bedside. All questions and concerns addressed. TR Band removed. No s/s of complications at the incison site. PT ambulated to the bathroom as able to void without difficulty. PT tolerated PO intake. PT was escorted from the unit via wheel chair to personal vehicle.

## 2024-03-01 NOTE — Discharge Instructions (Signed)

## 2024-03-01 NOTE — Interval H&P Note (Signed)
 History and Physical Interval Note:  03/01/2024 6:57 AM  Molly  D Carpenter  has presented today for surgery, with the diagnosis of heart failure.  The various methods of treatment have been discussed with the patient and family. After consideration of risks, benefits and other options for treatment, the patient has consented to  Procedures: RIGHT/LEFT HEART CATH AND CORONARY ANGIOGRAPHY (N/A) as a surgical intervention.  The patient's history has been reviewed, patient examined, no change in status, stable for surgery.  I have reviewed the patient's chart and labs.  Questions were answered to the patient's satisfaction.   Cath Lab Visit (complete for each Cath Lab visit)  Clinical Evaluation Leading to the Procedure:   ACS: No.  Non-ACS:    Anginal Classification: CCS I  Anti-ischemic medical therapy: Minimal Therapy (1 class of medications)  Non-Invasive Test Results: No non-invasive testing performed  Prior CABG: No previous CABG        [facility] 03/01/2024 6:57 AM

## 2024-03-02 ENCOUNTER — Encounter (HOSPITAL_COMMUNITY): Payer: Self-pay | Admitting: Cardiology

## 2024-03-12 ENCOUNTER — Ambulatory Visit

## 2024-03-12 DIAGNOSIS — I5022 Chronic systolic (congestive) heart failure: Secondary | ICD-10-CM

## 2024-03-13 ENCOUNTER — Ambulatory Visit: Payer: Self-pay | Admitting: Cardiology

## 2024-03-13 LAB — CUP PACEART REMOTE DEVICE CHECK
Battery Remaining Longevity: 112 mo
Battery Remaining Percentage: 94 %
Battery Voltage: 3.01 V
Brady Statistic AP VP Percent: 1.7 %
Brady Statistic AP VS Percent: 34 %
Brady Statistic AS VP Percent: 1 %
Brady Statistic AS VS Percent: 54 %
Brady Statistic RA Percent Paced: 17 %
Brady Statistic RV Percent Paced: 2.3 %
Date Time Interrogation Session: 20260113020019
Implantable Lead Connection Status: 753985
Implantable Lead Connection Status: 753985
Implantable Lead Implant Date: 20250228
Implantable Lead Implant Date: 20250228
Implantable Lead Location: 753859
Implantable Lead Location: 753860
Implantable Pulse Generator Implant Date: 20250228
Lead Channel Impedance Value: 450 Ohm
Lead Channel Impedance Value: 490 Ohm
Lead Channel Pacing Threshold Amplitude: 0.625 V
Lead Channel Pacing Threshold Amplitude: 1 V
Lead Channel Pacing Threshold Pulse Width: 0.5 ms
Lead Channel Pacing Threshold Pulse Width: 0.5 ms
Lead Channel Sensing Intrinsic Amplitude: 2.4 mV
Lead Channel Sensing Intrinsic Amplitude: 2.5 mV
Lead Channel Setting Pacing Amplitude: 1.625
Lead Channel Setting Pacing Amplitude: 2.5 V
Lead Channel Setting Pacing Pulse Width: 0.5 ms
Lead Channel Setting Sensing Sensitivity: 0.5 mV
Pulse Gen Model: 2272
Pulse Gen Serial Number: 8241895

## 2024-03-14 ENCOUNTER — Ambulatory Visit: Attending: Cardiology | Admitting: Cardiology

## 2024-03-14 ENCOUNTER — Encounter: Payer: Self-pay | Admitting: Cardiology

## 2024-03-14 VITALS — BP 137/78 | HR 76 | Ht 64.0 in | Wt 140.5 lb

## 2024-03-14 DIAGNOSIS — I10 Essential (primary) hypertension: Secondary | ICD-10-CM | POA: Diagnosis not present

## 2024-03-14 DIAGNOSIS — I493 Ventricular premature depolarization: Secondary | ICD-10-CM

## 2024-03-14 DIAGNOSIS — I2510 Atherosclerotic heart disease of native coronary artery without angina pectoris: Secondary | ICD-10-CM | POA: Diagnosis not present

## 2024-03-14 DIAGNOSIS — I5022 Chronic systolic (congestive) heart failure: Secondary | ICD-10-CM | POA: Diagnosis not present

## 2024-03-14 LAB — CUP PACEART INCLINIC DEVICE CHECK
Battery Remaining Longevity: 122 mo
Battery Voltage: 3.01 V
Brady Statistic RA Percent Paced: 17 %
Brady Statistic RV Percent Paced: 2.2 %
Date Time Interrogation Session: 20260115115906
Implantable Lead Connection Status: 753985
Implantable Lead Connection Status: 753985
Implantable Lead Implant Date: 20250228
Implantable Lead Implant Date: 20250228
Implantable Lead Location: 753859
Implantable Lead Location: 753860
Implantable Pulse Generator Implant Date: 20250228
Lead Channel Impedance Value: 450 Ohm
Lead Channel Impedance Value: 525 Ohm
Lead Channel Pacing Threshold Amplitude: 0.625 V
Lead Channel Pacing Threshold Amplitude: 1 V
Lead Channel Pacing Threshold Amplitude: 1 V
Lead Channel Pacing Threshold Pulse Width: 0.5 ms
Lead Channel Pacing Threshold Pulse Width: 0.5 ms
Lead Channel Pacing Threshold Pulse Width: 0.5 ms
Lead Channel Sensing Intrinsic Amplitude: 2.2 mV
Lead Channel Sensing Intrinsic Amplitude: 5 mV
Lead Channel Setting Pacing Amplitude: 1.625
Lead Channel Setting Pacing Amplitude: 2.5 V
Lead Channel Setting Pacing Pulse Width: 0.5 ms
Lead Channel Setting Sensing Sensitivity: 0.5 mV
Pulse Gen Model: 2272
Pulse Gen Serial Number: 8241895

## 2024-03-14 NOTE — Progress Notes (Signed)
 " Electrophysiology Office Note:   Date:  03/14/2024  ID:  Molly  D Carpenter, DOB Dec 29, 1950, MRN [id number]  Primary Cardiologist: Peter Jordan, MD Primary Heart Failure: None Electrophysiologist: Rayn Enderson Gladis Norton, MD      History of Present Illness:   Molly  D Carpenter is a 74 y.o. female with h/o coronary disease, hypertension, hyperlipidemia, diabetes, left bundle branch block seen today for routine electrophysiology followup.   She has had a waxing and waning ejection fraction.  Most recently, ejection fraction was found to be 30 to 35%.  She had a dual-chamber pacemaker placed, and was unable to place a coronary sinus lead due to anatomy.  PVC burden was 10% of the time.  Recent echo showed an ejection fraction of 25 to 30%.  Catheterization showed no new coronary artery disease.  Discussed the use of AI scribe software for clinical note transcription with the patient, who gave verbal consent to proceed.  History of Present Illness Molly Carpenter is a 74 year old female with a cardiac device who presents for evaluation of device function and potential adjustments. She was referred by Dr. Jordan for evaluation of her cardiac device function.  She has a history of high ventricular rate and atrial arrhythmias, which appear to be atrial driven. There have been episodes of transient under and over sensing, primarily under sensing, and possible intermittent block.  No symptoms of fatigue, tiredness, or shortness of breath. Her histograms show significant ectopy with atrial arrhythmias and not well-controlled rates. The sensing for the ventricle has dropped, possibly due to low conduction premature ventricular contractions.  She is currently dealing with pancreatic cancer and her husband, having started chemotherapy with plans for potential surgery after a few treatments. He previously had large C cell lymphoma, which was eradicated.   she denies chest pain, palpitations, dyspnea,  PND, orthopnea, nausea, vomiting, dizziness, syncope, edema, weight gain, or early satiety.   Review of systems complete and found to be negative unless listed in HPI.      EP Information / Studies Reviewed:    EKG is not ordered today. EKG from 03/01/2024 reviewed which showed sinus rhythm, ventricular paced      PPM Interrogation-  reviewed in detail today,  See PACEART report.  Arrhythmia/Device History: Abbott Dual Chamber PPM implanted 04/28/2023 for chronic systolic heart failure with left bundle branch block  Physical Exam:   VS:  BP 137/78 (BP Location: Right Arm, Patient Position: Sitting, Cuff Size: Normal)   Pulse 76   Ht 5' 4 (1.626 m)   Wt 140 lb 8 oz (63.7 kg)   SpO2 97%   BMI 24.12 kg/m    Wt Readings from Last 3 Encounters:  03/14/24 140 lb 8 oz (63.7 kg)  03/01/24 136 lb 12.8 oz (62.1 kg)  02/14/24 136 lb 9.6 oz (62 kg)     GEN: Well nourished, well developed in no acute distress NECK: No JVD; No carotid bruits CARDIAC: Regular rate and rhythm, no murmurs, rubs, gallops RESPIRATORY:  Clear to auscultation without rales, wheezing or rhonchi  ABDOMEN: Soft, non-tender, non-distended EXTREMITIES:  No edema; No deformity   ASSESSMENT AND PLAN:    Chronic systolic heart failure with left bundle branch block s/p Abbott PPM  Normal PPM function See Pace Art report No changes today  2.  Chronic systolic heart failure: Unable to tolerate ARB or Aldactone  due to renal side effects.  On Toprol -XL and Jardiance .  She underwent dual-chamber pacemaker insertion February 2025 though was unable  to place a coronary sinus lead due to poor anatomy.  Repeat echo showed ejection fraction of 25 to 30%.  She has poor coronary sinus anatomy, not amenable to lead placement or venoplasty to assist in lead placement.  In addition, she has a less than ideal left bundle QRS.  I am concerned that this synchrony is continuing to cause her issues with her ejection fraction.  Brodrick Curran refer  to cardiac surgery for possible epicardial LV lead placement.  3.  Coronary disease: No new disease on recent cath.  Plan per primary cardiology.  4.  Hypertension: Well-controlled  5.  PVCs: Burden of 10% on pacemaker check.  Minimally symptomatic.  Disposition:   Follow up with EP Team in 12 months  Signed, Contrina Orona Gladis Norton, MD  "

## 2024-03-14 NOTE — Patient Instructions (Addendum)
 Medication Instructions:  Your physician recommends that you continue on your current medications as directed. Please refer to the Current Medication list given to you today.  *If you need a refill on your cardiac medications before your next appointment, please call your pharmacy*  Lab Work: None ordered  If you have any lab test that is abnormal or we need to change your treatment, we will call you to review the results.  Testing/Procedures: None ordered  Follow-Up: At [facility], you and your health needs are our priority.  As part of our continuing mission to provide you with exceptional heart care, our providers are all part of one team.  This team includes your primary Cardiologist (physician) and Advanced Practice Providers or APPs (Physician Assistants and Nurse Practitioners) who all work together to provide you with the care you need, when you need it.   You have been referred to [facility] to discuss an epicardial LV lead   Your next appointment:   1 year(s)  Provider:   You will see one of the following Advanced Practice Providers on your designated Care Team:   Charlies Arthur, PA-C Michael Andy Tillery, PA-C Suzann Riddle, NP Daphne Barrack, NP Artist Pouch, PA-C      Thank you for choosing [facility]!!   Maeola Domino, RN [phone]

## 2024-03-15 ENCOUNTER — Ambulatory Visit: Payer: Self-pay | Admitting: Cardiology

## 2024-03-15 NOTE — Progress Notes (Signed)
 Remote PPM Transmission

## 2024-04-03 ENCOUNTER — Ambulatory Visit: Admitting: Surgery

## 2024-04-03 ENCOUNTER — Encounter: Payer: Self-pay | Admitting: Surgery

## 2024-04-03 VITALS — BP 147/66 | HR 79 | Resp 18 | Ht 64.0 in | Wt 137.0 lb

## 2024-04-03 DIAGNOSIS — I5022 Chronic systolic (congestive) heart failure: Secondary | ICD-10-CM

## 2024-04-03 DIAGNOSIS — I447 Left bundle-branch block, unspecified: Secondary | ICD-10-CM

## 2024-04-03 NOTE — Progress Notes (Unsigned)
 "  [address], [place] [address]             [phone]    PCP is Corlis Pagan, NP Referring Provider is Inocencio Soyla Lunger, MD  Chief Complaint  Patient presents with   Congestive Heart Failure    Surgical consult/ ECHO 02/05/24/ Cardiac Cath 03/01/24    HPI:  The patient is a 74 year old woman with a history of diabetes, hyperlipidemia, hypertension, left bundle branch block morphology and reduced ejection fraction that has been waxing and waning over the years.  Her most recent echocardiogram on 02/05/2024 showed an LVEF of 25 to 30% with grade 2 diastolic dysfunction.  There was trivial mitral regurgitation.  She underwent cardiac catheterization on 03/01/2024 showing 40% LAD stenosis with normal left ventricular end-diastolic pressure and a cardiac index of 2.3.  She underwent biventricular pacemaker implantation on 04/28/2023 by Dr. Inocencio.  A coronary sinus lead could not be placed due to anatomical abnormality so the right atrial and left bundle leads were connected to the generator.  She was referred to me for consideration of left thoracotomy and placement of an LV epicardial lead to upgrade to a biventricular system.  She is here today with her husband.  She said she remains very active and denies any shortness of breath or exertional fatigue.  She said that she can go up and down stairs multiple times per day at home without any trouble.  She has had no peripheral edema or orthopnea.  Her husband is currently undergoing treatment for a small pancreatic cancer with chemotherapy and hopefully eventual Whipple procedure.  Interestingly I had operated on him about 20 years ago.  Past Medical History:  Diagnosis Date   Chest tightness    Complication of anesthesia    Coronary artery disease    LHC 10/2000: dLAD 70-80% after a second septal perforator, EF 50%.  She was treated medically; Nuclear study 09/2010: EF 53%, fixed distal anterior, apical and septal  defect most likely related to LBBB, no ischemia.    Diabetes mellitus    Hyperlipidemia    Hypertension    Hypotension    LBBB (left bundle branch block)    PONV (postoperative nausea and vomiting)    Presence of permanent cardiac pacemaker     Past Surgical History:  Procedure Laterality Date   ABDOMINAL HYSTERECTOMY     BIV PACEMAKER INSERTION CRT-P N/A 04/28/2023   Procedure: BIV PACEMAKER INSERTION CRT-P;  Surgeon: Inocencio Soyla Lunger, MD;  Location: [facility] INVASIVE CV LAB;  Service: Cardiovascular;  Laterality: N/A;   bladder tack     CARDIAC CATHETERIZATION  11/21/2000   EF 55%. sHOWED A 70-80% STENOSIS IN THE SECOND SEPTAL PERFORATOR BRANCH.   CARDIOVASCULAR STRESS TEST  03/30/2006   EF 50%   CESAREAN SECTION     x2   ORIF ANKLE FRACTURE Right 12/14/2023   Procedure: OPEN REDUCTION INTERNAL FIXATION (ORIF) ANKLE FRACTURE;  Surgeon: Elsa Lonni SAUNDERS, MD;  Location: [facility] OR;  Service: Orthopedics;  Laterality: Right;   RIGHT/LEFT HEART CATH AND CORONARY ANGIOGRAPHY N/A 03/01/2024   Procedure: RIGHT/LEFT HEART CATH AND CORONARY ANGIOGRAPHY;  Surgeon: Jordan, Peter M, MD;  Location: [facility] INVASIVE CV LAB;  Service: Cardiovascular;  Laterality: N/A;   ROTATOR CUFF REPAIR Left    SYNDESMOSIS REPAIR Right 12/14/2023   Procedure: REPAIR, SYNDESMOSIS, ANKLE;  Surgeon: Elsa Lonni SAUNDERS, MD;  Location: [facility] OR;  Service: Orthopedics;  Laterality: Right;   US   ECHOCARDIOGRAPHY  09/25/2008   EF 50%    Family History  Problem Relation Age of Onset   Heart attack Mother    Stroke Father    Diabetes Sister    Hypertension Sister    Diabetes Brother    Heart attack Brother    Diabetes Brother    Breast cancer Neg Hx     Social History Social History[1]  Current Outpatient Medications  Medication Sig Dispense Refill   ACCU-CHEK GUIDE TEST test strip 1 each by Other route daily at 6 (six) AM.     aspirin  EC 81 MG tablet Take 1 tablet (81 mg total) by  mouth daily. Swallow whole. (Patient taking differently: Take 81 mg by mouth every evening. Swallow whole.) 90 tablet 3   Blood Glucose Monitoring Suppl (ACCU-CHEK GUIDE) w/Device KIT      empagliflozin  (JARDIANCE ) 10 MG TABS tablet Take 1 tablet (10 mg total) by mouth daily. 90 tablet 2   metoprolol  succinate (TOPROL -XL) 25 MG 24 hr tablet Take 25 mg by mouth in the morning.     OZEMPIC, 0.25 OR 0.5 MG/DOSE, 2 MG/3ML SOPN Inject 0.25 mg into the skin once a week.     rosuvastatin  (CRESTOR ) 10 MG tablet TAKE 1 TABLET(10 MG) BY MOUTH DAILY (Patient taking differently: Take 10 mg by mouth at bedtime.) 90 tablet 2   TRESIBA FLEXTOUCH 100 UNIT/ML FlexTouch Pen Inject 20 Units into the skin daily.     No current facility-administered medications for this visit.    Allergies[2]  Review of Systems  Constitutional:  Negative for activity change and fatigue.  HENT: Negative.    Eyes: Negative.   Respiratory:  Negative for chest tightness and shortness of breath.   Cardiovascular:  Negative for chest pain and leg swelling.  Gastrointestinal: Negative.   Endocrine: Negative.   Genitourinary: Negative.   Musculoskeletal: Negative.   Allergic/Immunologic: Negative.   Neurological:  Negative for dizziness and syncope.  Hematological: Negative.   Psychiatric/Behavioral: Negative.      BP (!) 147/66   Pulse 79   Resp 18   Ht 5' 4 (1.626 m)   Wt 137 lb (62.1 kg)   SpO2 98%   BMI 23.52 kg/m  Physical Exam Constitutional:      Appearance: Normal appearance. She is normal weight.  HENT:     Head: Normocephalic and atraumatic.  Eyes:     Extraocular Movements: Extraocular movements intact.     Conjunctiva/sclera: Conjunctivae normal.     Pupils: Pupils are equal, round, and reactive to light.  Neck:     Vascular: No carotid bruit.  Cardiovascular:     Rate and Rhythm: Normal rate and regular rhythm.     Pulses: Normal pulses.     Heart sounds: Normal heart sounds. No murmur  heard. Pulmonary:     Effort: Pulmonary effort is normal.     Breath sounds: Normal breath sounds.  Abdominal:     General: There is no distension.     Tenderness: There is no abdominal tenderness.  Musculoskeletal:        General: No swelling.  Skin:    General: Skin is warm and dry.  Neurological:     General: No focal deficit present.     Mental Status: She is alert and oriented to person, place, and time.  Psychiatric:        Mood and Affect: Mood normal.        Behavior: Behavior normal.  Diagnostic Tests:  ECHOCARDIOGRAM REPORT       Patient Name:   Molly  D Carpenter Date of Exam: 02/05/2024  Medical Rec #:  [id number]            Height:       64.0 in  Accession #:    [id number]           Weight:       146.0 lb  Date of Birth:  June 09, 1950            BSA:          1.711 m  Patient Age:    73 years             BP:           134/74 mmHg  Patient Gender: F                    HR:           91 bpm.  Exam Location:  [address]   Procedure: 2D Echo, Cardiac Doppler, Color Doppler and Intracardiac             Opacification Agent (Both Spectral and Color Flow Doppler were             utilized during procedure).   Indications:    I50.9 CONGESTIVE HEART FAILURE    History:        Patient has prior history of Echocardiogram examinations,  most                 recent 03/09/2023. CAD, Pacemaker, Arrythmias:LBBB; Risk                  Factors:Hypertension, Diabetes and Dyslipidemia.    Sonographer:    Carl Rodgers-Jones RDCS  Referring Phys: [phone] PETER M JORDAN   IMPRESSIONS     1. Left ventricular ejection fraction, by estimation, is 25 to 30%. The  left ventricle has severely decreased function. The left ventricle has no  regional wall motion abnormalities. Left ventricular diastolic parameters  are consistent with Grade II  diastolic dysfunction (pseudonormalization).   2. Right ventricular systolic function is normal. The right ventricular  size is  normal. Tricuspid regurgitation signal is inadequate for assessing  PA pressure.   3. The mitral valve is normal in structure. Trivial mitral valve  regurgitation. No evidence of mitral stenosis.   4. The aortic valve is normal in structure. Aortic valve regurgitation is  not visualized. No aortic stenosis is present.   5. The inferior vena cava is normal in size with greater than 50%  respiratory variability, suggesting right atrial pressure of 3 mmHg.   FINDINGS   Left Ventricle: Left ventricular ejection fraction, by estimation, is 25  to 30%. The left ventricle has severely decreased function. The left  ventricle has no regional wall motion abnormalities. Definity  contrast  agent was given IV to delineate the left   ventricular endocardial borders. The left ventricular internal cavity  size was normal in size. There is no left ventricular hypertrophy. Left  ventricular diastolic parameters are consistent with Grade II diastolic  dysfunction (pseudonormalization).   Right Ventricle: The right ventricular size is normal. No increase in  right ventricular wall thickness. Right ventricular systolic function is  normal. Tricuspid regurgitation signal is inadequate for assessing PA  pressure.   Left Atrium: Left atrial size was normal in size.   Right Atrium: Right atrial size was normal in size.  Pericardium: There is no evidence of pericardial effusion.   Mitral Valve: The mitral valve is normal in structure. Trivial mitral  valve regurgitation. No evidence of mitral valve stenosis.   Tricuspid Valve: The tricuspid valve is normal in structure. Tricuspid  valve regurgitation is not demonstrated. No evidence of tricuspid  stenosis.   Aortic Valve: The aortic valve is normal in structure. Aortic valve  regurgitation is not visualized. No aortic stenosis is present.   Pulmonic Valve: The pulmonic valve was normal in structure. Pulmonic valve  regurgitation is not visualized. No  evidence of pulmonic stenosis.   Aorta: The aortic root is normal in size and structure.   Venous: The inferior vena cava is normal in size with greater than 50%  respiratory variability, suggesting right atrial pressure of 3 mmHg.   IAS/Shunts: No atrial level shunt detected by color flow Doppler.     LEFT VENTRICLE  PLAX 2D  LVIDd:         4.60 cm   Diastology  LVIDs:         3.60 cm   LV e' medial:    3.80 cm/s  LV PW:         1.20 cm   LV E/e' medial:  19.0  LV IVS:        1.20 cm   LV e' lateral:   8.92 cm/s  LVOT diam:     2.00 cm   LV E/e' lateral: 8.1  LV SV:         43  LV SV Index:   25  LVOT Area:     3.14 cm     RIGHT VENTRICLE             IVC  RV Basal diam:  3.30 cm     IVC diam: 1.10 cm  RV S prime:     20.45 cm/s  TAPSE (M-mode): 2.4 cm   LEFT ATRIUM             Index        RIGHT ATRIUM           Index  LA diam:        4.00 cm 2.34 cm/m   RA Area:     10.70 cm  LA Vol (A2C):   41.3 ml 24.13 ml/m  RA Volume:   25.20 ml  14.72 ml/m  LA Vol (A4C):   28.4 ml 16.59 ml/m  LA Biplane Vol: 34.5 ml 20.16 ml/m   AORTIC VALVE  LVOT Vmax:   66.85 cm/s  LVOT Vmean:  44.550 cm/s  LVOT VTI:    0.136 m    AORTA  Ao Root diam: 2.60 cm  Ao Asc diam:  2.90 cm   MITRAL VALVE  MV Area (PHT): 3.08 cm     SHUNTS  MV Decel Time: 247 msec     Systemic VTI:  0.14 m  MV E velocity: 72.35 cm/s   Systemic Diam: 2.00 cm  MV A velocity: 132.50 cm/s  MV E/A ratio:  0.55   Morene Brownie  Electronically signed by Morene Brownie  Signature Date/Time: 02/05/2024/5:27:23 PM        Final     Procedures  RIGHT/LEFT HEART CATH AND CORONARY ANGIOGRAPHY   Conclusion      Prox LAD to Dist LAD lesion is 40% stenosed.   LV end diastolic pressure is normal.   Nonobstructive CAD Normal LV filling pressures. PCWP 13/18, mean 15 mm Hg. LVEDP 14  mm Hg Normal right heart pressures. PAP 30/15 mean 20 mm Hg Cardiac output 3.8 L/min, index 2.29   Plan: continue medical  therapy. Consider upgrading pacemaker to Left bundle lead.    Procedural Details  Technical Details Indication: 74 yo WF with chronic systolic CHF. Low EF  Procedural Details: The right wrist was prepped, draped, and anesthetized with 1% lidocaine . Ultrasound was used to guide access. Image was obtained and stored in the record.  Using the modified Seldinger technique a 6 Fr slender sheath was placed in the right radial artery and a 5 French sheath was placed in the right brachial vein. A Swan-Ganz catheter was used for the right heart catheterization. Standard protocol was followed for recording of right heart pressures and sampling of oxygen saturations. Fick cardiac output was calculated. Standard Judkins catheters were used for selective coronary angiography and left ventriculography. There were no immediate procedural complications. The patient was transferred to the post catheterization recovery area for further monitoring.  Contrast: 40 cc Estimated blood loss <50 mL.   During this procedure medications were administered to achieve and maintain moderate conscious sedation while the patient's heart rate, blood pressure, and oxygen saturation were continuously monitored and I was present face-to-face 100% of this time. From  7:37 AM to  7:58 AM, Leanna Mace Cardiovascular Specialist   and Suzen [facility] Cardiovascular Specialist, who are independent, trained observers, assisted in the monitoring of the patient's level of consciousness.   Medications (Filter: Administrations occurring from [phone] to 0806 on 03/01/24) Heparin  (Porcine) in NaCl 1000-0.9 UT/500ML-% SOLN (mL)  Total volume: 1,000 mL Date/Time Rate/Dose/Volume Action   03/01/24 0735 1,000 mL Given   fentaNYL  (SUBLIMAZE ) injection (mcg)  Total dose: 25 mcg Date/Time Rate/Dose/Volume Action   03/01/24 0738 25 mcg Given   midazolam  PF (VERSED ) injection (mg)  Total dose: 1 mg Date/Time Rate/Dose/Volume Action   03/01/24 0739  1 mg Given   lidocaine  (PF) (XYLOCAINE ) 1 % injection (mL)  Total volume: 5 mL Date/Time Rate/Dose/Volume Action   03/01/24 0740 5 mL Given   heparin  sodium (porcine) injection (Units)  Total dose: 3,000 Units Date/Time Rate/Dose/Volume Action   03/01/24 0753 3,000 Units Given   iohexol  (OMNIPAQUE ) 350 MG/ML injection (mL)  Total volume: 40 mL Date/Time Rate/Dose/Volume Action   03/01/24 0805 40 mL Given   Radial Cocktail/Verapamil  only (mL)  Total volume: 10 mL Date/Time Rate/Dose/Volume Action   03/01/24 0742 10 mL Given    Sedation Time  Sedation Time Physician-1: 21 minutes 6 seconds Contrast     Administrations occurring from 0717 to 0806 on 03/01/24:  Medication Name Total Dose  iohexol  (OMNIPAQUE ) 350 MG/ML injection 40 mL   Radiation/Fluoro  Fluoro time: 5.4 (min) DAP: 8.8 (Gycm2) Cumulative Air Kerma: 144.3 (mGy) Complications  Complications documented before study signed (03/01/2024  8:13 AM)   No complications were associated with this study.  Documented by Salinas Dimple Suzen CHRISTELLA - 03/01/2024  8:05 AM     Coronary Findings  Diagnostic Dominance: Right Left Main  Vessel was injected. Vessel is normal in caliber. Vessel is angiographically normal.    Left Anterior Descending  Prox LAD to Dist LAD lesion is 40% stenosed. The lesion is severely calcified.    Left Circumflex  Vessel was injected. Vessel is normal in caliber. Vessel is angiographically normal.    Right Coronary Artery  Vessel was injected. Vessel is normal in caliber. Vessel is angiographically normal.    Intervention   No interventions have been documented.  Left Heart  Left Ventricle LV end diastolic pressure is normal.   Coronary Diagrams  Diagnostic Dominance: Right  Intervention   Implants   No implant documentation for this case.   Syngo Images   Show images for CARDIAC CATHETERIZATION Images on Long Term Storage   Show images for Heaps, Kharizma  D Link to  Procedure Log  Procedure Log    Hemodynamics  Pressures Phases Air rest  Right     RA Mean  mmHg 7    RA A-Wave  mmHg 10    RA V-Wave  mmHg 7    RV  mmHg 30/7  Pulmonary     PA  mmHg 30/15 (20)    PCW Mean  mmHg 15.0    PCW A-Wave  mmHg 13.0    PCW V-Wave  mmHg 18.0    PAPi   2.1  Left     LV  mmHg 133/34    LVEDP  mmHg 15    LVPi   7    Saturations Phases Air rest    PA  % 64    Arterial  % 95    Hemo Data  Flowsheet Row Most Recent Value  Fick Cardiac Output 3.8 L/min  Fick Cardiac Output Index 2.29 (L/min)/BSA  Aortic Mean Gradient 10.31 mmHg  Aortic Peak Gradient 7.4 mmHg  Aortic Valve Area 1.39  Aortic Value Area Index 0.84 cm2/BSA  RA A Wave 10 mmHg  RA V Wave 7 mmHg  RA Mean 7 mmHg  RV Systolic Pressure 30 mmHg  RV Diastolic Pressure 7 mmHg  RV EDP 9 mmHg  PA Systolic Pressure 30 mmHg  PA Diastolic Pressure 15 mmHg  PA Mean 20 mmHg  PW A Wave 13 mmHg  PW V Wave 18 mmHg  PW Mean 15 mmHg  LV Systolic Pressure 133 mmHg  LV Diastolic Pressure 34 mmHg  LV EDP 15 mmHg  Arterial Occlusion Pressure Extended Systolic Pressure 132 mmHg  Arterial Occlusion Pressure Extended Diastolic Pressure 74 mmHg  Arterial Occlusion Pressure Extended Mean Pressure 84 mmHg  Left Ventricular Apex Extended Systolic Pressure 129 mmHg  LVp Diastolic Pressure 47 mmHg  Left Ventricular Apex Extended EDP Pressure 13 mmHg  QP/QS 1  TPVR Index 8.75 HRUI    Impression:   Plan:   Molly MARLA Fellers, MD [facility] [phone]       [1] Social History Tobacco Use   Smoking status: Never   Smokeless tobacco: Never  Vaping Use   Vaping status: Never Used  Substance Use Topics   Alcohol use: No    Alcohol/week: 0.0 standard drinks of alcohol   Drug use: No  [2] Allergies Allergen Reactions   Promethazine-Codeine     Other Reaction(s): stomach upset   Semaglutide Nausea And Vomiting    Other Reaction(s): Not  available  semaglutide  "

## 2024-04-04 ENCOUNTER — Encounter: Payer: Self-pay | Admitting: *Deleted

## 2024-04-04 ENCOUNTER — Other Ambulatory Visit: Payer: Self-pay | Admitting: *Deleted

## 2024-04-04 ENCOUNTER — Encounter: Payer: Self-pay | Admitting: Cardiology

## 2024-04-04 ENCOUNTER — Ambulatory Visit: Admitting: Cardiology

## 2024-04-04 VITALS — BP 116/70 | HR 67 | Ht 64.0 in | Wt 144.2 lb

## 2024-04-04 DIAGNOSIS — I119 Hypertensive heart disease without heart failure: Secondary | ICD-10-CM | POA: Insufficient documentation

## 2024-04-04 DIAGNOSIS — I5022 Chronic systolic (congestive) heart failure: Secondary | ICD-10-CM

## 2024-04-04 DIAGNOSIS — K219 Gastro-esophageal reflux disease without esophagitis: Secondary | ICD-10-CM | POA: Insufficient documentation

## 2024-04-04 DIAGNOSIS — I447 Left bundle-branch block, unspecified: Secondary | ICD-10-CM | POA: Diagnosis not present

## 2024-04-04 DIAGNOSIS — I2510 Atherosclerotic heart disease of native coronary artery without angina pectoris: Secondary | ICD-10-CM | POA: Diagnosis not present

## 2024-04-04 DIAGNOSIS — M8440XA Pathological fracture, unspecified site, initial encounter for fracture: Secondary | ICD-10-CM | POA: Insufficient documentation

## 2024-04-04 DIAGNOSIS — Z780 Asymptomatic menopausal state: Secondary | ICD-10-CM | POA: Insufficient documentation

## 2024-04-04 DIAGNOSIS — E7800 Pure hypercholesterolemia, unspecified: Secondary | ICD-10-CM | POA: Insufficient documentation

## 2024-04-04 DIAGNOSIS — I509 Heart failure, unspecified: Secondary | ICD-10-CM | POA: Insufficient documentation

## 2024-04-04 DIAGNOSIS — E113299 Type 2 diabetes mellitus with mild nonproliferative diabetic retinopathy without macular edema, unspecified eye: Secondary | ICD-10-CM | POA: Insufficient documentation

## 2024-04-04 DIAGNOSIS — N1831 Chronic kidney disease, stage 3a: Secondary | ICD-10-CM | POA: Insufficient documentation

## 2024-04-04 DIAGNOSIS — N184 Chronic kidney disease, stage 4 (severe): Secondary | ICD-10-CM | POA: Insufficient documentation

## 2024-04-04 NOTE — Patient Instructions (Addendum)
 Medication Instructions:  Continue same medications *If you need a refill on your cardiac medications before your next appointment, please call your pharmacy*  Lab Work: None ordered  Testing/Procedures: Echo to be done 2 months after new lead call and let Dr.Jordan know  Follow-Up: At [facility], you and your health needs are our priority.  As part of our continuing mission to provide you with exceptional heart care, our providers are all part of one team.  This team includes your primary Cardiologist (physician) and Advanced Practice Providers or APPs (Physician Assistants and Nurse Practitioners) who all work together to provide you with the care you need, when you need it.  Your next appointment:  4 months   Friday 6/5 at 8:00 am    Provider:  Dr.Jordan   We recommend signing up for the patient portal called MyChart.  Sign up information is provided on this After Visit Summary.  MyChart is used to connect with patients for Virtual Visits (Telemedicine).  Patients are able to view lab/test results, encounter notes, upcoming appointments, etc.  Non-urgent messages can be sent to your provider as well.   To learn more about what you can do with MyChart, go to [email or web address].

## 2024-04-17 ENCOUNTER — Ambulatory Visit: Admitting: Surgery

## 2024-04-23 ENCOUNTER — Other Ambulatory Visit (HOSPITAL_COMMUNITY)

## 2024-04-25 ENCOUNTER — Inpatient Hospital Stay (HOSPITAL_COMMUNITY): Admit: 2024-04-25 | Admitting: Surgery

## 2024-06-11 ENCOUNTER — Encounter

## 2024-08-02 ENCOUNTER — Ambulatory Visit: Admitting: Cardiology

## 2024-09-10 ENCOUNTER — Encounter

## 2024-12-10 ENCOUNTER — Encounter

## 2025-03-11 ENCOUNTER — Encounter
